# Patient Record
Sex: Female | Born: 1960 | Race: White | Hispanic: No | Marital: Married | Smoking: Never smoker
Health system: Southern US, Community
[De-identification: ages and names within clinical notes are randomized; demographics above are authoritative.]

## PROBLEM LIST (undated history)

## (undated) DIAGNOSIS — K219 Gastro-esophageal reflux disease without esophagitis: Secondary | ICD-10-CM

## (undated) DIAGNOSIS — N200 Calculus of kidney: Secondary | ICD-10-CM

## (undated) HISTORY — DX: Calculus of kidney: N20.0

## (undated) HISTORY — DX: Gastro-esophageal reflux disease without esophagitis: K21.9

## (undated) HISTORY — PX: EXTERNAL EAR SURGERY: SHX627

---

## 1999-10-03 ENCOUNTER — Other Ambulatory Visit: Admission: RE | Admit: 1999-10-03 | Discharge: 1999-10-03 | Payer: Self-pay | Admitting: *Deleted

## 2001-08-30 ENCOUNTER — Other Ambulatory Visit: Admission: RE | Admit: 2001-08-30 | Discharge: 2001-08-30 | Payer: Self-pay | Admitting: Obstetrics and Gynecology

## 2004-10-27 ENCOUNTER — Other Ambulatory Visit: Admission: RE | Admit: 2004-10-27 | Discharge: 2004-10-27 | Payer: Self-pay | Admitting: Obstetrics and Gynecology

## 2005-12-28 ENCOUNTER — Other Ambulatory Visit: Admission: RE | Admit: 2005-12-28 | Discharge: 2005-12-28 | Payer: Self-pay | Admitting: Obstetrics and Gynecology

## 2009-12-15 ENCOUNTER — Encounter: Payer: Self-pay | Admitting: Sports Medicine

## 2010-11-15 ENCOUNTER — Ambulatory Visit: Payer: Self-pay | Admitting: Sports Medicine

## 2010-11-15 DIAGNOSIS — M719 Bursopathy, unspecified: Secondary | ICD-10-CM

## 2010-11-15 DIAGNOSIS — M5030 Other cervical disc degeneration, unspecified cervical region: Secondary | ICD-10-CM | POA: Insufficient documentation

## 2010-11-15 DIAGNOSIS — M67919 Unspecified disorder of synovium and tendon, unspecified shoulder: Secondary | ICD-10-CM | POA: Insufficient documentation

## 2010-11-15 DIAGNOSIS — M542 Cervicalgia: Secondary | ICD-10-CM | POA: Insufficient documentation

## 2010-12-13 ENCOUNTER — Ambulatory Visit: Payer: Self-pay | Admitting: Sports Medicine

## 2011-01-02 ENCOUNTER — Ambulatory Visit
Admission: RE | Admit: 2011-01-02 | Discharge: 2011-01-02 | Payer: Self-pay | Source: Home / Self Care | Attending: Sports Medicine | Admitting: Sports Medicine

## 2011-01-02 ENCOUNTER — Encounter: Payer: Self-pay | Admitting: Sports Medicine

## 2011-01-12 ENCOUNTER — Encounter: Payer: Self-pay | Admitting: Sports Medicine

## 2011-01-12 ENCOUNTER — Encounter
Admission: RE | Admit: 2011-01-12 | Discharge: 2011-01-24 | Payer: Self-pay | Source: Home / Self Care | Attending: Sports Medicine | Admitting: Sports Medicine

## 2011-01-26 ENCOUNTER — Ambulatory Visit: Payer: PRIVATE HEALTH INSURANCE

## 2011-01-26 NOTE — Miscellaneous (Signed)
Summary: [facility]  [facility]   Imported By: Marily Memos 01/16/2011 09:19:30  _____________________________________________________________________  External Attachment:    Type:   Image     Comment:   External Document

## 2011-01-26 NOTE — Assessment & Plan Note (Signed)
Summary: F/U NECK/SHOULDER PAIN,[facility]   Vital Signs:  Patient profile:   50 year old female Pulse rate:   76 / minute BP sitting:   104 / 72  (left arm)  Vitals Entered By: Rochele Pages RN (January 02, 2011 11:18 AM)  CC:  neck and shoulder pain.  History of Present Illness: neck- dull annoying pain, not sharp or stabbing.  chronic discomfort, worse with sitting, better with activity.  takes advil PM for pain at night 2/2 difficulty sleeping.   doing exercises 3x/week.  Has been concentrating on her posture.  wants to avoid any injections.  complains of popping in her shoulder.   gets trapezius spasm primarily on RT and has more shoulder popping on  this side  Current Medications (verified): 1)  None  Allergies (verified): No Known Drug Allergies  Review of Systems  The patient denies weight gain, syncope, and dyspnea on exertion.    Physical Exam  General:  NAD thin Msk:  right neck.  Full ROM  negative spurlings.  no crepitus felt.  Trap spasm noted on RT and feels this w lat bend of neck to lt  some hypermobility of spine - able to put palms on floor - very mobile ROM at neckj  right shoulder: full ROM passive and active.  Mild decrease strength on empty can negative neers, negative hawkins, negative obriens.  good strength with internal and external rotation. scapular protraction on RT   neuuro ck for C5 to T1 reveals normal strength   Impression & Recommendations:  Problem # 1:  DEGENERATIVE DISC DISEASE, CERVICAL SPINE (ICD-722.4) to PT to help with trapezius spasm and scapular stabilzation exercises.  start amitryptilline at night.  Popping sensation in shoulder likely due to joint mobility.  RTC after 4 weeks of PT.  Problem # 2:  NECK PAIN (ICD-723.1) Assessment: Unchanged likely due to DDD.  continue exercise, posture, and tx as above.  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    Complete  Medication List: 1)  Amitriptyline Hcl 10 Mg Tabs (Amitriptyline hcl) .... Take one by mouth at bedtime and increase to 2 as directed Prescriptions: AMITRIPTYLINE HCL 10 MG TABS (AMITRIPTYLINE HCL) take one by mouth at bedtime and increase to 2 as directed  #60 x 3   Entered by:   Rochele Pages RN   Authorized by:   Enid Baas MD   Signed by:   Rochele Pages RN on 01/02/2011   Method used:   Electronically to        [facility] (retail)       [address]       [place], [place]  [address]       Ph: [phone] or [phone]       Fax: [phone]   RxID:   [id number]    Orders Added: 1)  Est. Patient Level III [phone]

## 2011-01-26 NOTE — Consult Note (Signed)
Summary: [facility]  [facility]   Imported By: Marily Memos 11/15/2010 11:12:33  _____________________________________________________________________  External Attachment:    Type:   Image     Comment:   External Document

## 2011-01-26 NOTE — Miscellaneous (Signed)
Summary: [facility] Referral form  [facility] Referral form   Imported By: Marily Memos 01/02/2011 13:45:44  _____________________________________________________________________  External Attachment:    Type:   Image     Comment:   External Document

## 2011-01-26 NOTE — Assessment & Plan Note (Signed)
Summary: NP NECK PAIN/[facility] WILL BRING MRI   Vital Signs:  Patient profile:   50 year old female Height:      61 inches Weight:      100 pounds BMI:     18.96 BP sitting:   112 / 76  Vitals Entered By: Lillia Pauls CMA (November 15, 2010 11:07 AM)   History of Present Illness: 50 yo female new pt coming here for neck pain Pt in 1994 had accident with horse but did not have any type of signs of acute problems.  Pt though over the years started to have pain right sided, radiates to right shoulder worse with certain movements, good strength, does not hurt everyday and still does ADL's.  Pt was seen at [facility] in 12/10 was told to do PT but only helped some and was told next step was injections which pt would like to avoid.    Pt did have an MRI done found to have tendonosis of the right rotator cuff.  MRI of neck showed bulging disk  of the C4-5.   there are anterior cystic changes; DDD adn mild changes at level above and below sclerotic vertebral body on plain film  Preventive Screening-Counseling & Management  Alcohol-Tobacco     Smoking Status: never  Current Medications (verified): 1)  None  Allergies (verified): No Known Drug Allergies  Past History:  unrelated to matter  Social History: Smoking Status:  never  Physical Exam  General:  Well-developed,well-nourished,in no acute distress; alert,appropriate and cooperative throughout examination Msk:  right neck.  Full ROM mild spasm of the right trapezius muscle.  negative spurlings.  no crepitus felt.  elevated first rib on right side. right shoulder: full ROM passive and active.  Mild decrease strength on empty can negative neers, negative hawkins, negative obriens.  good strength with internal and external rotation. 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 Will have pt attempt this at this time then NSAIDS as needed  do want to see pt again in 4months if not improving  Problem # 2:  NECK PAIN (ICD-723.1) no radiculopathy noted and good ROM with out pain.  No crepitus noted.  pt did have a mild elevated first rib and after verbal consent given FPR technique of the first rib improved pain.   Given exercises to help with posture and scapular dysfunction that could be contributing to the pain.  Gave pt red flags to look out for.  Will f/u in 4 months if needed to see if improvement.   Problem # 3:  DEGENERATIVE DISC DISEASE, CERVICAL SPINE (ICD-722.4) This could readily have come from accident there is some ant endplate compression and cystic erosion; edema in vert body on MRI  will follow and pursue conservative approach   Orders Added: 1)  Est. Patient Level IV [11914]

## 2011-01-30 ENCOUNTER — Ambulatory Visit: Payer: PRIVATE HEALTH INSURANCE | Admitting: Sports Medicine

## 2011-02-09 ENCOUNTER — Ambulatory Visit: Payer: Commercial Managed Care - PPO | Attending: Sports Medicine

## 2011-02-09 DIAGNOSIS — IMO0001 Reserved for inherently not codable concepts without codable children: Secondary | ICD-10-CM | POA: Insufficient documentation

## 2011-02-09 DIAGNOSIS — M25519 Pain in unspecified shoulder: Secondary | ICD-10-CM | POA: Insufficient documentation

## 2011-02-09 DIAGNOSIS — M542 Cervicalgia: Secondary | ICD-10-CM | POA: Insufficient documentation

## 2011-02-09 DIAGNOSIS — M6281 Muscle weakness (generalized): Secondary | ICD-10-CM | POA: Insufficient documentation

## 2011-02-13 ENCOUNTER — Ambulatory Visit: Payer: Commercial Managed Care - PPO

## 2011-02-15 ENCOUNTER — Ambulatory Visit: Payer: PRIVATE HEALTH INSURANCE | Admitting: Sports Medicine

## 2011-02-15 ENCOUNTER — Ambulatory Visit: Payer: Commercial Managed Care - PPO

## 2011-02-21 ENCOUNTER — Ambulatory Visit: Payer: Commercial Managed Care - PPO

## 2011-09-20 ENCOUNTER — Ambulatory Visit (INDEPENDENT_AMBULATORY_CARE_PROVIDER_SITE_OTHER): Payer: PRIVATE HEALTH INSURANCE | Admitting: Sports Medicine

## 2011-09-20 ENCOUNTER — Encounter: Payer: Self-pay | Admitting: Sports Medicine

## 2011-09-20 VITALS — BP 111/73 | HR 75 | Ht 62.0 in | Wt 105.0 lb

## 2011-09-20 DIAGNOSIS — M719 Bursopathy, unspecified: Secondary | ICD-10-CM

## 2011-09-20 DIAGNOSIS — M67919 Unspecified disorder of synovium and tendon, unspecified shoulder: Secondary | ICD-10-CM

## 2011-09-20 DIAGNOSIS — Z0000 Encounter for general adult medical examination without abnormal findings: Secondary | ICD-10-CM

## 2011-09-20 NOTE — Progress Notes (Signed)
  Subjective:    Patient ID: Sherri Wilson, female    DOB: 10/05/1961, 50 y.o.   MRN: [id number]  HPI Pain in left upper arm and shoulder Came w shifting tractor Now she has pain with using a pitchfork or doing too much vigorous activity with the left arm She does not have nighttime pain  RT shoulder had RCT Tennis w bursitis Injection and PT helped and RT is OK  ? About supplements No real health issues but she is concerned about whether she should take calcium and vitamin D and fish oil. She also has questions about estrogen replacement and alternatives for this. She currently is having a number of menopausal symptoms.   Review of Systems     Objective:   Physical Exam  No acute distress  RT Shoulder: Inspection reveals no abnormalities, atrophy or asymmetry. Palpation is normal with no tenderness over AC joint or bicipital groove. ROM is full in all planes. Rotator cuff strength normal throughout. No signs of impingement with negative Neer and Hawkin's tests, empty can. Speeds and Yergason's tests normal. No labral pathology noted with negative Obrien's, negative clunk and good stability. Normal scapular function observed. No painful arc and no drop arm sign. No apprehension sign  Left shoulder is also normal to evaluation with the exception that she has a positive empty can test and also a positive Hawkins test  Note that she has asymmetry with the right shoulder and upper chest wider than the left by 1 and 1/2 cm      Assessment & Plan:

## 2011-09-20 NOTE — Assessment & Plan Note (Signed)
She will start a series of rotator cuff exercises as described  She should not used the left shoulder for heavier work until this improves  We will recheck if not improved in 6 weeks

## 2011-09-20 NOTE — Patient Instructions (Signed)
First - work the rotator cuff as before 1.  Do some of the scapular exercises 2. Do band first below shoulder height and after 2 weeks above  To full elevation 3.  Go to light weight when band is easy - probably 3 lbs  Goal is only mild pain not more for rehab  Supplements Estrogen replacement - issues are risk of breast cancers and clots/  Benefit better bone density and probably less sxs  Optional approach is to look at diet and phyto estrogens  Calcium is key but maybe through food sources Vit D is a good additive but you may want to measure to decide  Fish Oil probably beneficial for heart/ maybe for joints - ? Is right form  Good approach may be a nutritional consult to see where you are and then decide. Wyona Almas , RD, PhD is a good resource  # day food diaries for you and Theron Arista and analyze and then do a consult

## 2011-09-20 NOTE — Assessment & Plan Note (Signed)
I discussed some of the advantages and disadvantages of various supplements.  I think she would do very well with a nutritional consult. She would prefer the idea of healthy nutrition to taking medications or supplements  Refer to [employer]

## 2011-10-27 ENCOUNTER — Emergency Department (HOSPITAL_COMMUNITY): Payer: 59

## 2011-10-27 ENCOUNTER — Emergency Department (HOSPITAL_COMMUNITY)
Admission: EM | Admit: 2011-10-27 | Discharge: 2011-10-27 | Disposition: A | Discharge status: Home / Self Care | Payer: 59 | Attending: Emergency Medicine | Admitting: Emergency Medicine

## 2011-10-27 DIAGNOSIS — R51 Headache: Secondary | ICD-10-CM | POA: Insufficient documentation

## 2011-10-27 DIAGNOSIS — S0010XA Contusion of unspecified eyelid and periocular area, initial encounter: Secondary | ICD-10-CM | POA: Insufficient documentation

## 2011-10-27 DIAGNOSIS — S8010XA Contusion of unspecified lower leg, initial encounter: Secondary | ICD-10-CM | POA: Insufficient documentation

## 2011-10-27 DIAGNOSIS — Y92009 Unspecified place in unspecified non-institutional (private) residence as the place of occurrence of the external cause: Secondary | ICD-10-CM | POA: Insufficient documentation

## 2011-10-27 DIAGNOSIS — M7989 Other specified soft tissue disorders: Secondary | ICD-10-CM | POA: Insufficient documentation

## 2011-10-27 DIAGNOSIS — IMO0002 Reserved for concepts with insufficient information to code with codable children: Secondary | ICD-10-CM | POA: Insufficient documentation

## 2012-09-17 ENCOUNTER — Ambulatory Visit: Payer: PRIVATE HEALTH INSURANCE | Admitting: Sports Medicine

## 2012-10-31 ENCOUNTER — Encounter: Payer: Self-pay | Admitting: Gastroenterology

## 2012-11-26 ENCOUNTER — Ambulatory Visit (INDEPENDENT_AMBULATORY_CARE_PROVIDER_SITE_OTHER): Payer: 59 | Admitting: Gastroenterology

## 2012-11-26 ENCOUNTER — Encounter: Payer: Self-pay | Admitting: Gastroenterology

## 2012-11-26 VITALS — BP 112/70 | HR 86 | Ht 62.0 in | Wt 102.3 lb

## 2012-11-26 DIAGNOSIS — K3189 Other diseases of stomach and duodenum: Secondary | ICD-10-CM

## 2012-11-26 DIAGNOSIS — Z1211 Encounter for screening for malignant neoplasm of colon: Secondary | ICD-10-CM

## 2012-11-26 MED ORDER — ESOMEPRAZOLE MAGNESIUM 40 MG PO CPDR: 40.0000 mg | DELAYED_RELEASE_CAPSULE | Freq: Every day | ORAL | Status: DC

## 2012-11-26 NOTE — Assessment & Plan Note (Signed)
Plan screening colonoscopy 

## 2012-11-26 NOTE — Assessment & Plan Note (Signed)
The patient's symptoms could be do to esophageal reflux. I also am suspicious that symptoms could be related to allergies or perhaps a postnasal drip.  Recommendations #1 trial of Nexium 40 mg daily

## 2012-11-26 NOTE — Progress Notes (Signed)
History of Present Illness: Pleasant 51 year old white female referred at the request of Dr. Marcelle Overlie for evaluation of indigestion. Approximately 6 months ago she had a persistent nonproductive cough for which she took over-the-counter Prilosec with perhaps some relief. Since that time she's had some fullness in her throat and has noticed excess belching. She denies pyrosis, per se. She denies dysphagia or hoarseness.    Past Medical History  Diagnosis Date  . Kidney stones    Past Surgical History  Procedure Date  . External ear surgery     plastic surgery, bitten by a horse   family history includes Hypertension in her father and Thyroid cancer in her daughter. Current Outpatient Prescriptions  Medication Sig Dispense Refill  . calcium carbonate (TUMS - DOSED IN MG ELEMENTAL CALCIUM) 500 MG chewable tablet Chew 1 tablet by mouth daily.      . citric acid-potassium citrate (POLYCITRA) 1100-334 MG/5ML solution Take 5 mLs by mouth 3 (three) times daily with meals.      . fish oil-omega-3 fatty acids 1000 MG capsule Take 2 g by mouth daily.      . progesterone (ENDOMETRIN) 100 MG vaginal insert Place 100 mg vaginally 2 (two) times daily.       Allergies as of 11/26/2012  . (No Known Allergies)    reports that she has never smoked. She has never used smokeless tobacco. She reports that she drinks alcohol. She reports that she does not use illicit drugs.     Review of Systems: Pertinent positive and negative review of systems were noted in the above HPI section. All other review of systems were otherwise negative.  Vital signs were reviewed in today's medical record Physical Exam: General: Well developed , well nourished, no acute distress Head: Normocephalic and atraumatic Eyes:  sclerae anicteric, EOMI Ears: Normal auditory acuity Mouth: No deformity or lesions Neck: Supple, no masses or thyromegaly Lungs: Clear throughout to auscultation Heart: Regular rate and rhythm; no  murmurs, rubs or bruits Abdomen: Soft, non tender and non distended. No masses, hepatosplenomegaly or hernias noted. Normal Bowel sounds Rectal:deferred Musculoskeletal: Symmetrical with no gross deformities  Skin: No lesions on visible extremities Pulses:  Normal pulses noted Extremities: No clubbing, cyanosis, edema or deformities noted Neurological: Alert oriented x 4, grossly nonfocal Cervical Nodes:  No significant cervical adenopathy Inguinal Nodes: No significant inguinal adenopathy Psychological:  Alert and cooperative. Normal mood and affect

## 2012-11-26 NOTE — Patient Instructions (Addendum)
It has been recommended to you by Dr Arlyce Dice that you schedule an elective colonoscopy We have given you Nexium samples today Call back in 2 weeks and let us know how your doing

## 2012-12-05 ENCOUNTER — Encounter: Payer: Self-pay | Admitting: Sports Medicine

## 2012-12-05 ENCOUNTER — Ambulatory Visit (INDEPENDENT_AMBULATORY_CARE_PROVIDER_SITE_OTHER): Payer: 59 | Admitting: Sports Medicine

## 2012-12-05 VITALS — BP 105/75 | HR 64 | Ht 62.0 in | Wt 102.0 lb

## 2012-12-05 DIAGNOSIS — M5030 Other cervical disc degeneration, unspecified cervical region: Secondary | ICD-10-CM

## 2012-12-05 MED ORDER — CYCLOBENZAPRINE HCL 5 MG PO TABS: 5.0000 mg | ORAL_TABLET | Freq: Every day | ORAL | Status: DC

## 2012-12-05 NOTE — Progress Notes (Signed)
  Subjective:    Patient ID: Sherri Wilson, female    DOB: Jun 18, 1961, 51 y.o.   MRN: [id number]  HPI  Pt presents to clinic for f/u of chronic rt sided neck pain. Has MRI showing DDD. Still has pain regularly, causes trouble sleeping. Takes ibuprofen as needed- which helps a little.  Rt shoulder pain resolved. Does home exercises regularly. PT helped.   In the past she try some amitriptyline for muscle spasm but this made her too sedated  Family history Older daughter has thyroid cancer that was successfully removed No other significant family history for breast or ovarian cancer  Past medical history  history of osteopenia She uses estradiol patches for this and I think he is taking intermittent Provera  Review of Systems     Objective:   Physical Exam Pleasant and in no acute distress  C5-T1 testing normal  Range of motion of the neck but she gets tightness and some pain on right rotation and right lateral bending Right trapezius shows spasm from the upper neck to the upper shoulder Reflexes are normal  Rt Shoulder: Inspection reveals no abnormalities, atrophy or asymmetry. Palpation is normal with no tenderness over AC joint or bicipital groove. ROM is full in all planes. Rotator cuff strength normal throughout. No signs of impingement with negative Neer and Hawkin's tests, empty can. Speeds and Yergason's tests normal. No labral pathology noted with negative Obrien's, negative clunk and good stability. Normal scapular function observed. No painful arc and no drop arm sign. No apprehension sign           Assessment & Plan:

## 2012-12-05 NOTE — Patient Instructions (Addendum)
Please do suggested exercises for your neck daily  Massage will also be helpful  Please follow up in 1 month  Thank you for seeing [place] today!

## 2012-12-05 NOTE — Assessment & Plan Note (Signed)
I gave her a series of the easy motion exercises and some isometric strength exercises for her neck  In addition we showed her a trapezius muscle energy technique and a technique for some self traction  I would like her to try Flexeril just 5 mg at nighttime for one month along with the exercise and see if we can give her some level of improvement

## 2013-01-14 ENCOUNTER — Ambulatory Visit (INDEPENDENT_AMBULATORY_CARE_PROVIDER_SITE_OTHER): Payer: 59 | Admitting: Sports Medicine

## 2013-01-14 VITALS — BP 105/68 | Ht 62.0 in | Wt 102.0 lb

## 2013-01-14 DIAGNOSIS — M7750 Other enthesopathy of unspecified foot: Secondary | ICD-10-CM

## 2013-01-14 DIAGNOSIS — M5030 Other cervical disc degeneration, unspecified cervical region: Secondary | ICD-10-CM

## 2013-01-14 DIAGNOSIS — M7740 Metatarsalgia, unspecified foot: Secondary | ICD-10-CM | POA: Insufficient documentation

## 2013-01-14 MED ORDER — CYCLOBENZAPRINE HCL 5 MG PO TABS: 5.0000 mg | ORAL_TABLET | Freq: Every day | ORAL | Status: DC

## 2013-01-14 NOTE — Assessment & Plan Note (Signed)
Metatarsal pad placed in shoes today She will use for the next week and if this does alleviate some of pain we will use the spread to measure her to place additional metatarsal pads in her hiking shoes and in her tennis shoes

## 2013-01-14 NOTE — Progress Notes (Signed)
HPI: Patient is a 52 year-old female who presents to clinic to follow up on neck pain and also to be evaluated for plantar metatarsal pain.  She has chronic neck pain, has been taking flexeril (Ibuprofen prior to flexeril) with good relief.  Has been doing neck motion and isometric strength exercises for one month, offering some improvement in neck pain.  MRI showed DDD. She rarely has to use Aleve but this does help when she has too much neck pain  Right foot pain: Patient states that pain occurs while hiking.  She points to lateral side of foot, plantar surface just over 3rd and 4th metatarsal heads.  States she also gets pain when standing for long periods of time.  Denies radiation of pain.  Pain relieved by rest.  Physical Exam: Neck: C4-T1 normal sensory and strength testing Has full ROM of neck.   Lateral flexion 5/5 strength. No radiation of pain down arms with movement of neck or arms.   + right trapezius spasm noted at level of shoulder Negative Spurling test  Right foot: Calluses noted over 3rd and 4th metatarsal heads. This is not seen on the left foot. No crepitus noted upon palpation of metatarsal joints. Mild TTP of metatarsals or metatarsal heads. Slight lateral deviation of right 2nd toe (compared with noticeable deviation of left 2nd toe.

## 2013-01-14 NOTE — Assessment & Plan Note (Signed)
Continue Flexeril Continue home exercise program Aleve as needed  Recheck in 3 months

## 2013-01-30 ENCOUNTER — Other Ambulatory Visit: Payer: Self-pay | Admitting: Sports Medicine

## 2013-03-11 ENCOUNTER — Telehealth: Payer: Self-pay | Admitting: Gastroenterology

## 2013-03-11 ENCOUNTER — Encounter: Payer: Self-pay | Admitting: Gastroenterology

## 2013-03-11 NOTE — Telephone Encounter (Signed)
Sherri Wilson,  Please call the patient and let her know that I got the ok for her to switch and to schedule her colonoscopy with Dr. Christella Hartigan.  She is looking at having the procedure on April 23rd when I spoke to her this morning.  Thanks,  Pattricia Boss

## 2013-03-11 NOTE — Telephone Encounter (Signed)
Appt scheduled for 04-16-13/Letter Sent to pt/yesi

## 2013-03-11 NOTE — Telephone Encounter (Signed)
Dr. Arlyce Dice,  Sherri Wilson is requesting to switch care to Dr. Christella Hartigan.  She said it is nothing against you but she is trying to schedule her colonoscopy for April and the dates that her husband can be with her, you are not scheduled to do procedures.  Thanks,    Pattricia Boss

## 2013-03-11 NOTE — Telephone Encounter (Signed)
ok 

## 2013-03-11 NOTE — Telephone Encounter (Signed)
Happy to help.  Colonoscopy for routine screening, LEC with moderate sedation.  Thanks

## 2013-04-03 ENCOUNTER — Ambulatory Visit (AMBULATORY_SURGERY_CENTER): Payer: 59 | Admitting: *Deleted

## 2013-04-03 VITALS — Ht 62.0 in | Wt 102.8 lb

## 2013-04-03 DIAGNOSIS — Z1211 Encounter for screening for malignant neoplasm of colon: Secondary | ICD-10-CM

## 2013-04-03 MED ORDER — PEG-KCL-NACL-NASULF-NA ASC-C 100 G PO SOLR: ORAL | Status: DC

## 2013-04-10 ENCOUNTER — Ambulatory Visit (INDEPENDENT_AMBULATORY_CARE_PROVIDER_SITE_OTHER): Payer: 59 | Admitting: Sports Medicine

## 2013-04-10 ENCOUNTER — Encounter: Payer: Self-pay | Admitting: Sports Medicine

## 2013-04-10 VITALS — BP 101/65 | HR 58 | Ht 62.0 in | Wt 102.0 lb

## 2013-04-10 DIAGNOSIS — M7750 Other enthesopathy of unspecified foot: Secondary | ICD-10-CM

## 2013-04-10 DIAGNOSIS — M7741 Metatarsalgia, right foot: Secondary | ICD-10-CM

## 2013-04-10 DIAGNOSIS — M67919 Unspecified disorder of synovium and tendon, unspecified shoulder: Secondary | ICD-10-CM

## 2013-04-10 DIAGNOSIS — M719 Bursopathy, unspecified: Secondary | ICD-10-CM

## 2013-04-10 DIAGNOSIS — M5030 Other cervical disc degeneration, unspecified cervical region: Secondary | ICD-10-CM

## 2013-04-10 MED ORDER — CYCLOBENZAPRINE HCL 5 MG PO TABS: 5.0000 mg | ORAL_TABLET | Freq: Every day | ORAL | Status: DC

## 2013-04-10 NOTE — Assessment & Plan Note (Signed)
Cont flexeril long term as needed to block radicular pain

## 2013-04-10 NOTE — Progress Notes (Signed)
  Subjective:    Patient ID: Sherri Wilson, female    DOB: 27-Nov-1961, 52 y.o.   MRN: [id number]  HPI  Pt presents to clinic for evaluation for orthotics. She has forefoot pain on the right foot that is improved with the MT pads that were added to her danskos.  She is going on a hiking trip this summer, and is concerned that her foot will bother her without increased support.   Neck pain was significantly helped by flexeril.  When she ran out of flexeril, neck pain returned.  Interested in getting refills.  She has been doing shoulder exercises - including up right rows and push ups- she has noticed that this has caused forearm pain.  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  Review of Systems     Objective:   Physical Exam  NAD   Standing Dropped 4th MT head on rt Good preservation of long arch bilat Trans arch slightly flattened bilat with lateral deviation of 2nd toe Small bunionettes bilat Calcaneal valgus  TTP over both forearms at mm belly; mild on RT lat epicondyle  Posture and neck motion are good     Assessment & Plan:

## 2013-04-10 NOTE — Patient Instructions (Addendum)
For forearm pain- use light weights to do slow wrist curls and ball squeezes  Ok to continue up right rows with light weight, but decrease or stop push ups  Please follow up as needed  Thank you for seeing [place] today!  Enjoy your hiking trip!

## 2013-04-10 NOTE — Assessment & Plan Note (Signed)
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

## 2013-04-10 NOTE — Assessment & Plan Note (Signed)
Sports insole to shoes for hiking with MT pad on RT over 3rd and 4th MT shaft  Cont small MT pads in all shoes Reck prn

## 2013-04-16 ENCOUNTER — Encounter: Payer: Self-pay | Admitting: Gastroenterology

## 2013-04-16 ENCOUNTER — Ambulatory Visit (AMBULATORY_SURGERY_CENTER): Payer: 59 | Admitting: Gastroenterology

## 2013-04-16 VITALS — BP 90/50 | HR 66 | Temp 97.1°F | Resp 12 | Ht 62.0 in | Wt 102.0 lb

## 2013-04-16 DIAGNOSIS — Z1211 Encounter for screening for malignant neoplasm of colon: Secondary | ICD-10-CM

## 2013-04-16 MED ORDER — SODIUM CHLORIDE 0.9 % IV SOLN: 500.0000 mL | INTRAVENOUS | Status: DC

## 2013-04-16 NOTE — Progress Notes (Signed)
Patient did not experience any of the following events: a burn prior to discharge; a fall within the facility; wrong site/side/patient/procedure/implant event; or a hospital transfer or hospital admission upon discharge from the facility. (G8907) Patient did not have preoperative order for IV antibiotic SSI prophylaxis. (G8918)  

## 2013-04-16 NOTE — Patient Instructions (Signed)
Normal colon exam today. Repeat colonoscopy in 10 years. Resume current medications. Call us with any questions or concerns. Thank you!!  YOU HAD AN ENDOSCOPIC PROCEDURE TODAY AT THE [facility]: Refer to the procedure report that was given to you for any specific questions about what was found during the examination.  If the procedure report does not answer your questions, please call your gastroenterologist to clarify.  If you requested that your care partner not be given the details of your procedure findings, then the procedure report has been included in a sealed envelope for you to review at your convenience later.  YOU SHOULD EXPECT: Some feelings of bloating in the abdomen. Passage of more gas than usual.  Walking can help get rid of the air that was put into your GI tract during the procedure and reduce the bloating. If you had a lower endoscopy (such as a colonoscopy or flexible sigmoidoscopy) you may notice spotting of blood in your stool or on the toilet paper. If you underwent a bowel prep for your procedure, then you may not have a normal bowel movement for a few days.  DIET: Your first meal following the procedure should be a light meal and then it is ok to progress to your normal diet.  A half-sandwich or bowl of soup is an example of a good first meal.  Heavy or fried foods are harder to digest and may make you feel nauseous or bloated.  Likewise meals heavy in dairy and vegetables can cause extra gas to form and this can also increase the bloating.  Drink plenty of fluids but you should avoid alcoholic beverages for 24 hours.  ACTIVITY: Your care partner should take you home directly after the procedure.  You should plan to take it easy, moving slowly for the rest of the day.  You can resume normal activity the day after the procedure however you should NOT DRIVE or use heavy machinery for 24 hours (because of the sedation medicines used during the test).    SYMPTOMS TO  REPORT IMMEDIATELY: A gastroenterologist can be reached at any hour.  During normal business hours, 8:30 AM to 5:00 PM Monday through Friday, call [phone].  After hours and on weekends, please call the GI answering service at [phone] who will take a message and have the physician on call contact you.   Following lower endoscopy (colonoscopy or flexible sigmoidoscopy):  Excessive amounts of blood in the stool  Significant tenderness or worsening of abdominal pains  Swelling of the abdomen that is new, acute  Fever of 100F or higher  Following upper endoscopy (EGD)  Vomiting of blood or coffee ground material  New chest pain or pain under the shoulder blades  Painful or persistently difficult swallowing  New shortness of breath  Fever of 100F or higher  Black, tarry-looking stools  FOLLOW UP: If any biopsies were taken you will be contacted by phone or by letter within the next 1-3 weeks.  Call your gastroenterologist if you have not heard about the biopsies in 3 weeks.  Our staff will call the home number listed on your records the next business day following your procedure to check on you and address any questions or concerns that you may have at that time regarding the information given to you following your procedure. This is a courtesy call and so if there is no answer at the home number and we have not heard from you through the emergency physician   on call, we will assume that you have returned to your regular daily activities without incident.  SIGNATURES/CONFIDENTIALITY: You and/or your care partner have signed paperwork which will be entered into your electronic medical record.  These signatures attest to the fact that that the information above on your After Visit Summary has been reviewed and is understood.  Full responsibility of the confidentiality of this discharge information lies with you and/or your care-partner. 

## 2013-04-16 NOTE — Op Note (Signed)
[facility] [address] [place], [address]   COLONOSCOPY PROCEDURE REPORT  PATIENT: Sherri Wilson, Sherri D.  MR#: [id number] BIRTHDATE: 11-06-1961 , 51  yrs. old GENDER: Female ENDOSCOPIST: Rachael Fee, MD REFERRED [id number] Marcelle Overlie, M.D. PROCEDURE DATE:  04/16/2013 PROCEDURE:   Colonoscopy, screening ASA CLASS:   Class II INDICATIONS:average risk screening. MEDICATIONS: Fentanyl 75 mcg IV, Versed 6 mg IV, and These medications were titrated to patient response per physician's verbal order  DESCRIPTION OF PROCEDURE:   After the risks benefits and alternatives of the procedure were thoroughly explained, informed consent was obtained.  A digital rectal exam revealed no abnormalities of the rectum.   The LB PCF-H180AL [id number]  endoscope was introduced through the anus and advanced to the cecum, which was identified by both the appendix and ileocecal valve. No adverse events experienced.   The quality of the prep was good.  The instrument was then slowly withdrawn as the colon was fully examined.   COLON FINDINGS: A normal appearing cecum, ileocecal valve, and appendiceal orifice were identified.  The ascending, hepatic flexure, transverse, splenic flexure, descending, sigmoid colon and rectum appeared unremarkable.  No polyps or cancers were seen. Retroflexed views revealed no abnormalities. The time to cecum=3 minutes 22 seconds.  Withdrawal time=6 minutes 33 seconds.  The scope was withdrawn and the procedure completed. COMPLICATIONS: There were no complications.  ENDOSCOPIC IMPRESSION: Normal colon No polyps or cancers  RECOMMENDATIONS: You should continue to follow colorectal cancer screening guidelines for "routine risk" patients with a repeat colonoscopy in 10 years. There is no need for FOBT (stool) testing for at least 5 years.   eSigned:  Rachael Fee, MD 04/16/2013 3:31 PM

## 2013-04-17 ENCOUNTER — Telehealth: Payer: Self-pay | Admitting: *Deleted

## 2013-04-17 NOTE — Telephone Encounter (Signed)
  Follow up Call-  Call back number 04/16/2013  Post procedure Call Back phone  # [phone]  Permission to leave phone message Yes     Patient questions:  Do you have a fever, pain , or abdominal swelling? no Pain Score  0 *  Have you tolerated food without any problems? yes  Have you been able to return to your normal activities? yes  Do you have any questions about your discharge instructions: Diet   no Medications  no Follow up visit  no  Do you have questions or concerns about your Care? no  Actions: * If pain score is 4 or above: No action needed, pain <4.

## 2013-05-07 ENCOUNTER — Encounter: Payer: 59 | Admitting: Sports Medicine

## 2013-06-16 ENCOUNTER — Other Ambulatory Visit: Payer: Self-pay | Admitting: Obstetrics and Gynecology

## 2013-06-16 DIAGNOSIS — R928 Other abnormal and inconclusive findings on diagnostic imaging of breast: Secondary | ICD-10-CM

## 2013-06-24 ENCOUNTER — Ambulatory Visit
Admission: RE | Admit: 2013-06-24 | Discharge: 2013-06-24 | Disposition: A | Discharge status: Home / Self Care | Payer: 59 | Source: Ambulatory Visit | Attending: Obstetrics and Gynecology | Admitting: Obstetrics and Gynecology

## 2013-06-24 DIAGNOSIS — R928 Other abnormal and inconclusive findings on diagnostic imaging of breast: Secondary | ICD-10-CM

## 2014-03-17 ENCOUNTER — Encounter: Payer: Self-pay | Admitting: Sports Medicine

## 2014-03-17 ENCOUNTER — Ambulatory Visit (INDEPENDENT_AMBULATORY_CARE_PROVIDER_SITE_OTHER): Payer: 59 | Admitting: Sports Medicine

## 2014-03-17 VITALS — BP 92/65 | Ht 62.0 in | Wt 95.0 lb

## 2014-03-17 DIAGNOSIS — M7750 Other enthesopathy of unspecified foot: Secondary | ICD-10-CM

## 2014-03-17 DIAGNOSIS — M7740 Metatarsalgia, unspecified foot: Secondary | ICD-10-CM

## 2014-03-17 DIAGNOSIS — M5030 Other cervical disc degeneration, unspecified cervical region: Secondary | ICD-10-CM

## 2014-03-17 MED ORDER — CYCLOBENZAPRINE HCL 5 MG PO TABS: 5.0000 mg | ORAL_TABLET | Freq: Every day | ORAL | Status: DC

## 2014-03-17 NOTE — Progress Notes (Signed)
   Subjective:    Patient ID: Sherri Wilson, female    DOB: 1961-10-21, 53 y.o.   MRN: [id number]  HPI  Sherri Wilson has a history of cervical deg. Disc Dz and chronic neck pain who returns to clinic for flexeril refill. She takes flexeril at night which significantly improves her symptoms. She is able to sleep well. During the day she is able to tolerate normal activities without being limited due to pain. She continues to do neck exercises.   She is also here complaining about right knee pain. Pain is located on the patella. Pain is worse with any compression--"wearing skinny jeans"--or something rubbing against it. Has mild discomfort with activity but denies pain with hiking, stairs, walking. She would like to proactive about injury prevention and is interested in exercises to prevent further knee pain. Denies locking, buckling, night pain.   She also has a history of foot pain and loss of transverse arch. She was previously given metatarsal pads but these did not improve symptoms and she was not able to tolerate them. She says that she is able to tolerate foot pain currently but is developing a callus on the right foot.    Review of Systems Negative apart from HPI     Objective:   Physical Exam NAD BP 92/65  Ht 5\' 2"  (1.575 m)  Wt 95 lb (43.092 kg)  BMI 17.37 kg/m2  Knee Inspection: no erythema, swelling. Palpation: Mild tenderness to palpation on medial patella. No tenderness along the joint line. ROM: Normal strength and ROM. Some crepitus with extension of both knees. Patella is tracking on the midline. VMO weakness noted Hip abduction strength excellent  Neck: Normal ROM without pain. No trapezius spasms.   Foot (Right): Callus developing at 4th metatarsal. Some loss of transverse arch bilaterally.      Assessment & Plan:  53 yo with chronic neck pain and new right knee pain.   1. Chronic Neck Pain: due to cervical DDD. Responsive to flexeril --Refill 5 MG flexeril,  safe to continue taking --Continue to do push-up exercises to strengthen trapezius  2. Right knee pain: Most likely VMO weakness --Excercises--straight leg raise and extension while holding something between the knees  3. Foot pain: callus developing on plantar metatarsals --Morton's neuroma pad insert to place just proximal to the callus  Seen with Jacqulyn Liner, MS4

## 2014-03-17 NOTE — Assessment & Plan Note (Signed)
Cont Flexeril long term  RF for 1 yr  HEP

## 2014-03-17 NOTE — Assessment & Plan Note (Signed)
MT pads uncomfortable  Will try neuroma pad

## 2014-04-30 ENCOUNTER — Encounter: Payer: Self-pay | Admitting: Sports Medicine

## 2014-04-30 ENCOUNTER — Ambulatory Visit (INDEPENDENT_AMBULATORY_CARE_PROVIDER_SITE_OTHER): Payer: 59 | Admitting: Sports Medicine

## 2014-04-30 VITALS — BP 103/71 | Ht 62.0 in | Wt 100.0 lb

## 2014-04-30 DIAGNOSIS — M7740 Metatarsalgia, unspecified foot: Secondary | ICD-10-CM

## 2014-04-30 DIAGNOSIS — M7750 Other enthesopathy of unspecified foot: Secondary | ICD-10-CM

## 2014-04-30 NOTE — Progress Notes (Signed)
Patient ID: Sherri Wilson, female   DOB: 1961/02/10, 53 y.o.   MRN: [id number]  Patient enters with persistent forefoot pain We had treated her for metatarsalgia with different types of metatarsal pads However, using these has never totally got rid of her pain She is wondering about trying some orthotics She works outside most days and also likes to walk and to hike A lot of these activities are causing significant foot pain  Examination No acute distress BP 103/71  Ht 5\' 2"  (1.575 m)  Wt 100 lb (45.36 kg)  BMI 18.29 kg/m2  Alignment is good Strength is good Forefoot shows complete loss of the transverse arch of the right foot The right fourth metatarsal head has subluxed to the plantar surface There is pain at the metatarsal head and just distal to this Longitudinal arch is normal but may have dropped from her baseline  Left foot shows mild loss of the transverse arch The forefoot metatarsal head also subluxes to the plantar surface but is freely movable No real abnormal callusing No tenderness to palpation  Walking gait is normal

## 2014-04-30 NOTE — Assessment & Plan Note (Signed)
And other conservative measures have failed we decided to try a pair of cushioned orthotics  Patient was fitted for a : standard, cushioned, semi-rigid orthotic. The orthotic was heated and afterward the patient stood on the orthotic blank positioned on the orthotic stand. The patient was positioned in subtalar neutral position and 10 degrees of ankle dorsiflexion in a weight bearing stance. After completion of molding, a stable base was applied to the orthotic blank. The blank was ground to a stable position for weight bearing. Size: 7 EVA Base: blue EVA Posting: Lateral RT forefoot Additional orthotic padding: none  Evaluation in preparation, 45 minutes face-to-face  Walking gait after completion of orthotics was much more comfortable  She will test these over the next one to 2 months but if we need to add additional metatarsal padding we will do so to the dorsal surface

## 2015-03-31 ENCOUNTER — Encounter: Payer: Self-pay | Admitting: Sports Medicine

## 2015-03-31 ENCOUNTER — Ambulatory Visit (INDEPENDENT_AMBULATORY_CARE_PROVIDER_SITE_OTHER): Payer: 59 | Admitting: Sports Medicine

## 2015-03-31 VITALS — BP 112/70 | HR 68 | Ht 61.0 in | Wt 100.0 lb

## 2015-03-31 DIAGNOSIS — M7740 Metatarsalgia, unspecified foot: Secondary | ICD-10-CM

## 2015-03-31 NOTE — Progress Notes (Signed)
Patient ID: Sherri Wilson, female   DOB: September 28, 1961, 53 y.o.   MRN: [id number]  The patient has had long-term problems with both forefeet Pain her orthotics one year ago Pain is returning to the forefeet and she is developing some corns She generally wears hiking Boot that gives her more support Sandals with an arch helped for a short period  Orthotics initially helped but less so in the last few months  Examination Thin and in no acute distress BP 112/70 mmHg  Pulse 68  Ht 5\' 1"  (1.549 m)  Wt 100 lb (45.36 kg)  BMI 18.90 kg/m2  Right foot shows significant callusing at the distal fourth ray Fourth MTP joint is subluxed to the plantar surface Mild transverse arch collapse  Left foot shows callusing across the metatarsal pad Morton's stopped callus and a distal corn at the second plantar MTP  Both feet show loss of the normal fat pad  Orthotic show significant wear over the metatarsal region

## 2015-03-31 NOTE — Assessment & Plan Note (Signed)
On the right foot I placed a neuroma pad distal metatarsal 4  Additional padding to the orthotic in the forefoot  Left orthotic I placed additional padding across the metatarsal area  She is to test these corrections but I think I need to make a new orthotics in 4-6 weeks as  she has worn thru the forefoot on the current pair

## 2015-04-07 ENCOUNTER — Other Ambulatory Visit: Payer: Self-pay | Admitting: Sports Medicine

## 2015-05-19 ENCOUNTER — Ambulatory Visit (INDEPENDENT_AMBULATORY_CARE_PROVIDER_SITE_OTHER): Payer: 59 | Admitting: Sports Medicine

## 2015-05-19 ENCOUNTER — Encounter: Payer: Self-pay | Admitting: Sports Medicine

## 2015-05-19 VITALS — BP 105/69 | HR 53 | Ht 61.0 in | Wt 100.0 lb

## 2015-05-19 DIAGNOSIS — M7740 Metatarsalgia, unspecified foot: Secondary | ICD-10-CM

## 2015-05-19 NOTE — Assessment & Plan Note (Signed)
The patient has had some interval breakdown of the left foot with more callusing The right foot looks improved from before Much less tenderness to palpation over the right  Patient was fitted for a standard, cushioned, semi-rigid orthotic.  The orthotic was heated and the patient stood on the orthotic blank positioned on the orthotic stand. The patient was positioned in subtalar neutral position and 10 degrees of ankle dorsiflexion in a weight bearing stance. After molding, a stable [employer] EVA base was applied to the orthotic blank.   The blank was ground to a stable position for weight bearing. Size: 7 EVA base: Blue EVA med. posting: none additional orthotic padding: extra small MT pads bilat  Evaluation time was 40 minutes  At completion the patient had good support and can walk in a neutral position

## 2015-05-19 NOTE — Progress Notes (Signed)
Patient ID: Sherri Wilson, female   DOB: 1961/10/13, 54 y.o.   MRN: [id number]  Patient has a history of chronic forefoot pain bilaterally Traditionally has been more on the right but now some on the left as well We placed her in the orthotics with metatarsal padding several years ago When she uses the orthotic she is able to do normal work and walking  She comes back today because the orthotics are starting to wear out Foot pain has increased as the orthotics are getting older  Examination No acute distress BP 105/69 mmHg  Pulse 53  Ht 5\' 1"  (1.549 m)  Wt 100 lb (45.36 kg)  BMI 18.90 kg/m2  Patient has long thin foot Fairly neutral longitudinal arch bilaterally Metatarsal pad on the plantar surface is significantly thinned out bilaterally Abnormal callus at fourth metatarsal head and just distal to the third metatarsal head on the right Fourth metatarsal heads seems subluxed through the pad  On the left she has abnormal callus slightly of the fourth metatarsal head and distal to the second metatarsal head There is some drop of the second and fourth metatarsals This does not seem to be through the foot pad

## 2015-06-16 ENCOUNTER — Other Ambulatory Visit: Payer: Self-pay | Admitting: Obstetrics and Gynecology

## 2015-06-16 DIAGNOSIS — R928 Other abnormal and inconclusive findings on diagnostic imaging of breast: Secondary | ICD-10-CM

## 2015-06-22 ENCOUNTER — Ambulatory Visit
Admission: RE | Admit: 2015-06-22 | Discharge: 2015-06-22 | Disposition: A | Discharge status: Home / Self Care | Payer: 59 | Source: Ambulatory Visit | Attending: Obstetrics and Gynecology | Admitting: Obstetrics and Gynecology

## 2015-06-22 DIAGNOSIS — R928 Other abnormal and inconclusive findings on diagnostic imaging of breast: Secondary | ICD-10-CM

## 2015-09-22 ENCOUNTER — Ambulatory Visit: Payer: 59 | Admitting: Sports Medicine

## 2015-10-27 ENCOUNTER — Ambulatory Visit: Payer: 59 | Admitting: Sports Medicine

## 2015-11-17 ENCOUNTER — Ambulatory Visit: Payer: 59 | Admitting: Sports Medicine

## 2016-03-21 DIAGNOSIS — D2262 Melanocytic nevi of left upper limb, including shoulder: Secondary | ICD-10-CM | POA: Diagnosis not present

## 2016-03-21 DIAGNOSIS — D2272 Melanocytic nevi of left lower limb, including hip: Secondary | ICD-10-CM | POA: Diagnosis not present

## 2016-03-21 DIAGNOSIS — D1801 Hemangioma of skin and subcutaneous tissue: Secondary | ICD-10-CM | POA: Diagnosis not present

## 2016-03-21 DIAGNOSIS — D225 Melanocytic nevi of trunk: Secondary | ICD-10-CM | POA: Diagnosis not present

## 2016-03-21 DIAGNOSIS — L821 Other seborrheic keratosis: Secondary | ICD-10-CM | POA: Diagnosis not present

## 2016-03-21 DIAGNOSIS — L812 Freckles: Secondary | ICD-10-CM | POA: Diagnosis not present

## 2016-03-21 DIAGNOSIS — D2271 Melanocytic nevi of right lower limb, including hip: Secondary | ICD-10-CM | POA: Diagnosis not present

## 2016-03-21 DIAGNOSIS — D2261 Melanocytic nevi of right upper limb, including shoulder: Secondary | ICD-10-CM | POA: Diagnosis not present

## 2016-03-28 ENCOUNTER — Ambulatory Visit: Payer: 59 | Admitting: Sports Medicine

## 2016-04-03 ENCOUNTER — Encounter: Payer: Self-pay | Admitting: Sports Medicine

## 2016-04-03 ENCOUNTER — Ambulatory Visit (INDEPENDENT_AMBULATORY_CARE_PROVIDER_SITE_OTHER): Payer: 59 | Admitting: Sports Medicine

## 2016-04-03 VITALS — BP 97/62 | HR 66 | Ht 61.0 in | Wt 100.0 lb

## 2016-04-03 DIAGNOSIS — M7740 Metatarsalgia, unspecified foot: Secondary | ICD-10-CM

## 2016-04-11 ENCOUNTER — Other Ambulatory Visit: Payer: Self-pay | Admitting: Sports Medicine

## 2016-05-03 DIAGNOSIS — H52223 Regular astigmatism, bilateral: Secondary | ICD-10-CM | POA: Diagnosis not present

## 2016-05-03 DIAGNOSIS — H524 Presbyopia: Secondary | ICD-10-CM | POA: Diagnosis not present

## 2016-05-03 DIAGNOSIS — H5203 Hypermetropia, bilateral: Secondary | ICD-10-CM | POA: Diagnosis not present

## 2016-05-31 NOTE — Assessment & Plan Note (Signed)
We will review this in future as we ran into a time conflict and she will return for that.  No charge as not seen today.

## 2016-05-31 NOTE — Progress Notes (Signed)
Patient ID: Sherri Wilson, female   DOB: [birth year], 55 y.o.   MRN: [id number]  Patient returning for foot issues  We ran into a time conflict as I had an urgent visit and so she rescheduled.

## 2016-06-14 DIAGNOSIS — Z1231 Encounter for screening mammogram for malignant neoplasm of breast: Secondary | ICD-10-CM | POA: Diagnosis not present

## 2016-06-14 DIAGNOSIS — Z13820 Encounter for screening for osteoporosis: Secondary | ICD-10-CM | POA: Diagnosis not present

## 2016-06-14 DIAGNOSIS — Z01419 Encounter for gynecological examination (general) (routine) without abnormal findings: Secondary | ICD-10-CM | POA: Diagnosis not present

## 2016-06-14 DIAGNOSIS — Z681 Body mass index (BMI) 19 or less, adult: Secondary | ICD-10-CM | POA: Diagnosis not present

## 2016-06-15 ENCOUNTER — Ambulatory Visit (INDEPENDENT_AMBULATORY_CARE_PROVIDER_SITE_OTHER): Payer: 59 | Admitting: Sports Medicine

## 2016-06-15 ENCOUNTER — Encounter: Payer: Self-pay | Admitting: Sports Medicine

## 2016-06-15 VITALS — BP 92/57 | Ht 62.0 in | Wt 98.0 lb

## 2016-06-15 DIAGNOSIS — M7740 Metatarsalgia, unspecified foot: Secondary | ICD-10-CM

## 2016-06-15 MED ORDER — CYCLOBENZAPRINE HCL 5 MG PO TABS: 5.0000 mg | ORAL_TABLET | Freq: Every day | ORAL | Status: DC

## 2016-06-15 NOTE — Assessment & Plan Note (Signed)
Unable to tolerate the metatarsal pad on the right. This seems to be placed 2 proximally which was most likely irritating her metatarsal shafts. We went ahead and adjusted this today. Repeat orthotics in the next 3-6 months. She also has evidence of bunionette's which fifth ray posting was added today.

## 2016-06-15 NOTE — Progress Notes (Signed)
Patient ID: Sherri Wilson, female   DOB: [birth year], 55 y.o.   MRN: [id number]   Patient returns today for bilateral foot pain.  Patient has a history of chronic forefoot pain bilaterally Traditionally has been more on the right but now some on the left as well We placed her in the orthotics with metatarsal padding several years ago When she uses the orthotic she is able to do normal work and walking  Patient with previous orthotics and metatarsal pads. Metatarsal pad on the right was giving her more irritation so she removed this from the orthotic. She continues to have pain in the midfoot area. We'll also having pain in the fifth dorsal MTP region.  Past medical family social history exam findings are reviewed and updated.  Examination No acute distress, aao x 3 BP 92/57 mmHg  Ht 5\' 2"  (1.575 m)  Wt 98 lb (44.453 kg)  BMI 17.92 kg/m2  Patient has long thin foot Fairly neutral longitudinal arch bilaterally Metatarsal pad on the plantar surface is significantly thinned out bilaterally Abnormal callus at fourth metatarsal head and just distal to the third metatarsal head on the right + irritation and bunionette 5th mtp  On the left she has abnormal callus slightly of the fourth metatarsal head and distal to the second metatarsal head There is some drop of the second and fourth metatarsals

## 2016-12-20 ENCOUNTER — Other Ambulatory Visit: Payer: Self-pay | Admitting: Family Medicine

## 2017-05-24 DIAGNOSIS — D2272 Melanocytic nevi of left lower limb, including hip: Secondary | ICD-10-CM | POA: Diagnosis not present

## 2017-05-24 DIAGNOSIS — L812 Freckles: Secondary | ICD-10-CM | POA: Diagnosis not present

## 2017-05-24 DIAGNOSIS — D1801 Hemangioma of skin and subcutaneous tissue: Secondary | ICD-10-CM | POA: Diagnosis not present

## 2017-05-24 DIAGNOSIS — L821 Other seborrheic keratosis: Secondary | ICD-10-CM | POA: Diagnosis not present

## 2017-05-24 DIAGNOSIS — D2262 Melanocytic nevi of left upper limb, including shoulder: Secondary | ICD-10-CM | POA: Diagnosis not present

## 2017-05-24 DIAGNOSIS — D2271 Melanocytic nevi of right lower limb, including hip: Secondary | ICD-10-CM | POA: Diagnosis not present

## 2017-05-24 DIAGNOSIS — D2261 Melanocytic nevi of right upper limb, including shoulder: Secondary | ICD-10-CM | POA: Diagnosis not present

## 2017-05-24 DIAGNOSIS — D225 Melanocytic nevi of trunk: Secondary | ICD-10-CM | POA: Diagnosis not present

## 2017-05-24 DIAGNOSIS — L814 Other melanin hyperpigmentation: Secondary | ICD-10-CM | POA: Diagnosis not present

## 2017-06-26 DIAGNOSIS — Z1231 Encounter for screening mammogram for malignant neoplasm of breast: Secondary | ICD-10-CM | POA: Diagnosis not present

## 2017-06-26 DIAGNOSIS — Z681 Body mass index (BMI) 19 or less, adult: Secondary | ICD-10-CM | POA: Diagnosis not present

## 2017-06-26 DIAGNOSIS — Z01419 Encounter for gynecological examination (general) (routine) without abnormal findings: Secondary | ICD-10-CM | POA: Diagnosis not present

## 2018-02-14 ENCOUNTER — Encounter: Payer: Self-pay | Admitting: Sports Medicine

## 2018-02-14 ENCOUNTER — Ambulatory Visit: Payer: 59 | Admitting: Sports Medicine

## 2018-02-14 DIAGNOSIS — M7740 Metatarsalgia, unspecified foot: Secondary | ICD-10-CM

## 2018-02-14 NOTE — Assessment & Plan Note (Signed)
Increase in flattening of transverse arch since last visit Lt > RT More IP callus  Trial with orthotic modification Float the first IP joint bilat 5th MTP joint pad bilat  If these help we need to go ahead and make new orthotics and add similar cushion

## 2018-02-14 NOTE — Progress Notes (Signed)
CC: left foot pain - great toe  Patient in custom orthotics since 2015 These lessened her foot pain She uses them for walking and work Work is primarily caring for horses Hx of forefoot pain Gradually improved MT pad left and neuroma pad RT Additonal padding for first ray  Recently more pain under IP joint of left grt toe More callus Some on RT  ROS No swelling of great toe No pain in other areas of foot No hx trauma  PE Pleasant thin F in NAD BP (!) 84/50   Ht 5\' 2"  (1.575 m)   Wt 102 lb (46.3 kg)   BMI 18.66 kg/m   TTP at plantar surface of IP joint toe 1 left Thick callus over this area Normal ROM of IP and MTP RT IP joint 1 shows no TTP No swelling of any foot joint  Standing shows loss of transverse arch bilat. Buninette pad over dorsum of 5th MTP bilat

## 2018-05-22 ENCOUNTER — Encounter: Payer: Self-pay | Admitting: Gastroenterology

## 2018-05-22 ENCOUNTER — Ambulatory Visit: Payer: 59 | Admitting: Gastroenterology

## 2018-05-22 ENCOUNTER — Encounter (INDEPENDENT_AMBULATORY_CARE_PROVIDER_SITE_OTHER): Payer: Self-pay

## 2018-05-22 VITALS — BP 80/50 | HR 72 | Ht 62.0 in | Wt 99.0 lb

## 2018-05-22 DIAGNOSIS — R194 Change in bowel habit: Secondary | ICD-10-CM | POA: Diagnosis not present

## 2018-05-22 NOTE — Patient Instructions (Addendum)
Please start taking citrucel (orange flavored) powder fiber supplement.  This may cause some bloating at first but that usually goes away. Begin with a small spoonful and work your way up to a large, heaping spoonful daily over a week. Stay on the daily fiber throughout your travels. Take imodium one pill daily (IF NEEDED) when you travel. Call after your July trip to report on your response.  Normal BMI (Body Mass Index- based on height and weight) is between 19 and 25. Your BMI today is Body mass index is 18.11 kg/m. Marland Kitchen Please consider follow up  regarding your BMI with your Primary Care Provider.

## 2018-05-22 NOTE — Progress Notes (Signed)
HPI: This is a very pleasant 57 year old woman who was referred to me by Molli Posey, MD  to evaluate bowel changes.    Chief complaint is bowel changes when she travels and then she has significant urgency,  [place] Road[place] trips, hiking a lot.  Out of country at times but not always.  At first she is constipated fecal incontinence at times even.  One BM daily is her usual.  No pushing.  Psych stress causes constipation.  Diet changes  Overall stable weight  No blood in her stools.  Father has colon polyps   Old Data Reviewed: Colonoscopy Dr.Jacobs April 2014 for routine risk colon cancer screening was normal.  She was recommended to undergo repeat colon cancer screening at 10-year interval.     Review of systems: Pertinent positive and negative review of systems were noted in the above HPI section. All other review negative.   Past Medical History:  Diagnosis Date  . GERD (gastroesophageal reflux disease)   . Kidney stones     Past Surgical History:  Procedure Laterality Date  . EXTERNAL EAR SURGERY Left    plastic surgery, bitten by a horse    Current Outpatient Medications  Medication Sig Dispense Refill  . Calcium Citrate-Vitamin D (CITRACAL PETITES/VITAMIN D PO) Take 2 tablets by mouth daily.    Marland Kitchen estradiol (VIVELLE-DOT) 0.05 MG/24HR Place 1 patch onto the skin once a week.    . fish oil-omega-3 fatty acids 1000 MG capsule Take 2 g by mouth daily.    . progesterone (PROMETRIUM) 100 MG capsule Take 100 mg by mouth daily.     No current facility-administered medications for this visit.     Allergies as of 05/22/2018  . (No Known Allergies)    Family History  Problem Relation Age of Onset  . Hypertension Father   . Colon polyps Father   . Thyroid cancer Daughter   . Colon cancer Neg Hx   . Esophageal cancer Neg Hx   . Stomach cancer Neg Hx   . Rectal cancer Neg Hx     Social History   Socioeconomic History  . Marital status: Married    Spouse  name: Not on file  . Number of children: 3  . Years of education: Not on file  . Highest education level: Not on file  Occupational History  . Occupation: homemaker  Social Needs  . Financial resource strain: Not on file  . Food insecurity:    Worry: Not on file    Inability: Not on file  . Transportation needs:    Medical: Not on file    Non-medical: Not on file  Tobacco Use  . Smoking status: Never Smoker  . Smokeless tobacco: Never Used  Substance and Sexual Activity  . Alcohol use: Yes    Comment: social alcohol  . Drug use: No  . Sexual activity: Not on file  Lifestyle  . Physical activity:    Days per week: Not on file    Minutes per session: Not on file  . Stress: Not on file  Relationships  . Social connections:    Talks on phone: Not on file    Gets together: Not on file    Attends religious service: Not on file    Active member of club or organization: Not on file    Attends meetings of clubs or organizations: Not on file    Relationship status: Not on file  . Intimate partner violence:    Fear of  current or ex partner: Not on file    Emotionally abused: Not on file    Physically abused: Not on file    Forced sexual activity: Not on file  Other Topics Concern  . Not on file  Social History Narrative  . Not on file     Physical Exam: BP (!) 80/50   Pulse 72   Ht 5\' 2"  (1.575 m)   Wt 99 lb (44.9 kg)   BMI 18.11 kg/m  Constitutional: generally well-appearing Psychiatric: alert and oriented x3 Eyes: extraocular movements intact Mouth: oral pharynx moist, no lesions Neck: supple no lymphadenopathy Cardiovascular: heart regular rate and rhythm Lungs: clear to auscultation bilaterally Abdomen: soft, nontender, nondistended, no obvious ascites, no peritoneal signs, normal bowel sounds Extremities: no lower extremity edema bilaterally Skin: no lesions on visible extremities   Assessment and plan: 58 y.o. female with stress, travel related bowel  changes  She is very clear that her bowels are absolutely normal unless she travels.  She normally has 1 bowel movement a day shortly after her morning coffee.  It is solid and she does not have to push or strain.  She does not see any bleeding.  Occasionally during times of emotional, psychological stress she will have constipation for a few days.  When she travels she often has upfront constipation and then urgency, significant loose stools with fecal incontinence even.  I do not think she has chronic infection.  I so no need for repeat colonoscopy or other testing right now.  I think this is a functional issue.  She is going to start taking over-the-counter daily fiber supplements now and continue that regimen while she travels.  She will also bring with her some Imodium and she knows that if she starts to have significant loose stools when she travels despite the fiber supplement she will take an Imodium 1 pill once daily and up to 2 or 3 pills once daily if needed.  She will report back to me after her first big trip that is in late July    Please see the "Patient Instructions" section for addition details about the plan.   Owens Loffler, MD [facility] 05/22/2018, 9:56 AM  Cc: Molli Posey, MD

## 2018-05-24 DIAGNOSIS — D2261 Melanocytic nevi of right upper limb, including shoulder: Secondary | ICD-10-CM | POA: Diagnosis not present

## 2018-05-24 DIAGNOSIS — D2262 Melanocytic nevi of left upper limb, including shoulder: Secondary | ICD-10-CM | POA: Diagnosis not present

## 2018-05-24 DIAGNOSIS — L821 Other seborrheic keratosis: Secondary | ICD-10-CM | POA: Diagnosis not present

## 2018-05-24 DIAGNOSIS — D1801 Hemangioma of skin and subcutaneous tissue: Secondary | ICD-10-CM | POA: Diagnosis not present

## 2018-05-24 DIAGNOSIS — D2272 Melanocytic nevi of left lower limb, including hip: Secondary | ICD-10-CM | POA: Diagnosis not present

## 2018-05-24 DIAGNOSIS — D2271 Melanocytic nevi of right lower limb, including hip: Secondary | ICD-10-CM | POA: Diagnosis not present

## 2018-05-24 DIAGNOSIS — D225 Melanocytic nevi of trunk: Secondary | ICD-10-CM | POA: Diagnosis not present

## 2018-05-24 DIAGNOSIS — L812 Freckles: Secondary | ICD-10-CM | POA: Diagnosis not present

## 2018-06-20 ENCOUNTER — Encounter: Payer: Self-pay | Admitting: Sports Medicine

## 2018-06-20 ENCOUNTER — Ambulatory Visit: Payer: 59 | Admitting: Sports Medicine

## 2018-06-20 DIAGNOSIS — M7740 Metatarsalgia, unspecified foot: Secondary | ICD-10-CM | POA: Diagnosis not present

## 2018-06-20 NOTE — Assessment & Plan Note (Signed)
Patient was fitted for a : standard, cushioned, semi-rigid orthotic. The orthotic was heated and afterward the patient stood on the orthotic blank positioned on the orthotic stand. The patient was positioned in subtalar neutral position and 10 degrees of ankle dorsiflexion in a weight bearing stance. After completion of molding, a stable base was applied to the orthotic blank. The blank was ground to a stable position for weight bearing. Size:7 red med. EVA Base: blue med. EVA Posting: MT cookies bilateral Additional orthotic padding: Posting on plantar surface MTP 1 and 5 Posting at distal phalanx 1 bilat  At conclusion walking gait was comfortable and was in neutral position  Advised to cont. Using orthotics in all shoes for walking and yard/ farm work These should provide about 3 years of good support RECK as needed

## 2018-06-20 NOTE — Progress Notes (Signed)
CC: Bilateral forefoot pain  Patient first treated with custom orthotics > 4 years ago with bilateral forefoot pain Exam suggested metatarsalgia Padding and custom orthotics have been helpful Planning hiking trip Older orthotics not as effective now and comes for new ones  ROS No swelling in feet No numbness in feet  PE Thin F in NAD BP (!) 93/58   Ht 5\' 2"  (1.575 m)   Wt 102 lb (46.3 kg)   BMI 18.66 kg/m   Loss of transverse arch bilat MT heads 2,3,4 bilat not cushioned with thin fat pad No real TTP today Long arch slightly decreased First phalanx medailly rotated with callus bilat Walking gait with mild pronation

## 2018-06-24 DIAGNOSIS — Z1321 Encounter for screening for nutritional disorder: Secondary | ICD-10-CM | POA: Diagnosis not present

## 2018-06-24 DIAGNOSIS — Z13228 Encounter for screening for other metabolic disorders: Secondary | ICD-10-CM | POA: Diagnosis not present

## 2018-06-24 DIAGNOSIS — Z13220 Encounter for screening for lipoid disorders: Secondary | ICD-10-CM | POA: Diagnosis not present

## 2018-06-24 DIAGNOSIS — Z1329 Encounter for screening for other suspected endocrine disorder: Secondary | ICD-10-CM | POA: Diagnosis not present

## 2018-07-03 DIAGNOSIS — Z1231 Encounter for screening mammogram for malignant neoplasm of breast: Secondary | ICD-10-CM | POA: Diagnosis not present

## 2018-07-03 DIAGNOSIS — Z01419 Encounter for gynecological examination (general) (routine) without abnormal findings: Secondary | ICD-10-CM | POA: Diagnosis not present

## 2018-07-03 DIAGNOSIS — Z13820 Encounter for screening for osteoporosis: Secondary | ICD-10-CM | POA: Diagnosis not present

## 2018-07-03 DIAGNOSIS — Z681 Body mass index (BMI) 19 or less, adult: Secondary | ICD-10-CM | POA: Diagnosis not present

## 2018-07-23 ENCOUNTER — Encounter: Payer: Self-pay | Admitting: Physical Therapy

## 2018-07-23 ENCOUNTER — Other Ambulatory Visit: Payer: Self-pay

## 2018-07-23 ENCOUNTER — Ambulatory Visit: Payer: 59 | Attending: Obstetrics and Gynecology | Admitting: Physical Therapy

## 2018-07-23 DIAGNOSIS — R279 Unspecified lack of coordination: Secondary | ICD-10-CM | POA: Insufficient documentation

## 2018-07-23 DIAGNOSIS — M6281 Muscle weakness (generalized): Secondary | ICD-10-CM | POA: Insufficient documentation

## 2018-07-23 NOTE — Therapy (Signed)
[facility] [address] [place], [place], [address] Phone: [phone]   Fax:  [phone]  Physical Therapy Evaluation  Patient Details  Name: Sherri Wilson MRN: [id number] Date of Birth: 1961-11-16 Referring Provider: Dr. Molli Posey   Encounter Date: 07/23/2018  PT End of Session - 07/23/18 1201    Visit Number  1    Date for PT Re-Evaluation  10/15/18    Authorization Type  UMR    PT Start Time  1015    PT Stop Time  1100    PT Time Calculation (min)  45 min    Activity Tolerance  Patient tolerated treatment well    Behavior During Therapy  [facility] for tasks assessed/performed       Past Medical History:  Diagnosis Date  . GERD (gastroesophageal reflux disease)   . Kidney stones     Past Surgical History:  Procedure Laterality Date  . EXTERNAL EAR SURGERY Left    plastic surgery, bitten by a horse    There were no vitals filed for this visit.   Subjective Assessment - 07/23/18 1013    Subjective  Leak urine with jogging, running, ride horses has to wear a pad,     Patient Stated Goals  reduce urinary leakage    Currently in Pain?  No/denies    Multiple Pain Sites  No         [facility] PT Assessment - 07/23/18 0001      Assessment   Medical Diagnosis  N39.3 Stress urinary incontinence    Referring Provider  Dr. Molli Posey    Onset Date/Surgical Date  07/23/17    Prior Therapy  none      Precautions   Precautions  None      Restrictions   Weight Bearing Restrictions  No      Balance Screen   Has the patient fallen in the past 6 months  No    Has the patient had a decrease in activity level because of a fear of falling?   No    Is the patient reluctant to leave their home because of a fear of falling?   No      Home Film/video editor residence      Prior Function   Level of Independence  Independent    Vocation  Retired    Leisure  ride horses, run,  Hospital doctor, gym 3x/week      Cognition   Overall Cognitive Status  Within [employer] for tasks assessed      Posture/Postural Control   Posture/Postural Control  No significant limitations      ROM / Strength   AROM / PROM / Strength  AROM;Strength;PROM      AROM   Lumbar Extension  decreased by 25%      PROM   Right Hip External Rotation   55    Left Hip External Rotation   55      Strength   Left Hip ABduction  3+/5      Palpation   SI assessment   right ilium is anteriorly rotated; sacrum rotated left    Palpation comment  tight abdominal tissue, unable to perform diaphragmatic breathing      Transfers   Transfers  Not assessed      Ambulation/Gait   Ambulation/Gait  No                Objective measurements completed  on examination: See above findings.    Pelvic Floor Special Questions - 07/23/18 0001    Prior Pregnancies  Yes    Number of Pregnancies  3    Number of Vaginal Deliveries  3    Currently Sexually Active  Yes    Is this Painful  No    Urinary Leakage  Yes    Pad use  1    Activities that cause leaking  Exercising;Other;Coughing;Sneezing;Laughing    Other activities that cause leaking  horseback riding    Urinary urgency  No    Fecal incontinence  Yes after traveling, gets constipated    Caffeine beverages  2 cups    Skin Integrity  Intact dry    External Palpation  clitoral hood is starting to adhere to the clitoris    Prolapse  Anterior Wall 1 cm from the introitus    Pelvic Floor Internal Exam  Patient confirms identification and approves PT to assess pelvic floor and treat    Exam Type  Vaginal    Palpation  tightness of the ichiococcygeus, sacrotuberus ligament; decreased movement of bilatteral urethra sphincter    Strength  weak squeeze, no lift               PT Education - 07/23/18 1200    Education Details  pelvic floor contraction, toileting technique, abdominal massage    Person(s) Educated  Patient    Methods   Explanation;Demonstration;Verbal cues;Handout    Comprehension  Verbalized understanding;Returned demonstration       PT Short Term Goals - 07/23/18 1209      PT SHORT TERM GOAL #1   Title  independent with initial HEP    Time  4    Period  Weeks    Status  New    Target Date  08/20/18      PT SHORT TERM GOAL #2   Title  ability to perform diaphgramatic breathing to improve mobility of pelvic floor    Time  4    Period  Weeks    Status  New    Target Date  08/20/18      PT SHORT TERM GOAL #3   Title  understand correct toileting technique to not strain the pelvic floor    Time  4    Period  Weeks    Status  New    Target Date  08/20/18      PT SHORT TERM GOAL #4   Title  understand how to perform abdominal massage to improve motility of the intestines and bowel movements    Time  4    Period  Weeks    Status  New    Target Date  08/20/18        PT Long Term Goals - 07/23/18 1215      PT LONG TERM GOAL #1   Title  independent with HEP and understand how to progress herself    Time  12    Period  Weeks    Status  New    Target Date  10/15/18      PT LONG TERM GOAL #2   Title  urinary leakage is minimal to none with horseback riding, running, and exercise due to improve coordination of the pelvic floor    Time  12    Period  Weeks    Status  New    Target Date  07/30/18      PT LONG TERM GOAL #3  Title  fecal leakage is none with traveling due to understanding how to reduce constipation    Time  12    Period  Weeks    Status  New    Target Date  10/15/18      PT LONG TERM GOAL #4   Title  pelvic floor strength >/= 4/5 without contracting the gluteals     Time  12    Period  Weeks    Status  New    Target Date  10/15/18             Plan - 07/23/18 1202    Clinical Impression Statement  Patient is a 57 year old female with stress urinary incontinence for the past year with exercise, riding horses. Patient reports issues of constipation and  sometimes fecal leakage.  Patient reports no pain.  As patient bears downt the bladder comes 1 cm before the introitus. Patient pelvic floor strength is 2/5 with difficulty isolating the contraction from contracting the gluteals.  Palpable tenderness located in the ischiococcygeus and decreased mobility of the urethra spincter and bulbocavernosus.  Patient has restrictions of the clitoral hood and understands how to release them.  Patient is not able to release her abdominals for diaphgramatic breathing.  Bilateral hip extension is 4/5.  Patient will wear 1 pad with horseback riding. Patient will benefit from skilled therapy to improve pelvic floor coordination, lengthen the muslces and reduce leakage.     History and Personal Factors relevant to plan of care:  none    Clinical Presentation  Stable    Clinical Presentation due to:  stable condition    Clinical Decision Making  Low    Rehab Potential  Excellent    Clinical Impairments Affecting Rehab Potential  none    PT Frequency  1x / week    PT Duration  12 weeks    PT Treatment/Interventions  Biofeedback;Therapeutic activities;Therapeutic exercise;Patient/family education;Neuromuscular re-education;Manual techniques;Energy conservation    PT Next Visit Plan  review toileting and abdominal massage, diaphgramatic breathing, pelvic floor soft tissue work, flexibility exercises for hips, lumbar, correct pelvis    Consulted and Agree with Plan of Care  Patient       Patient will benefit from skilled therapeutic intervention in order to improve the following deficits and impairments:  Increased fascial restricitons, Increased muscle spasms, Decreased activity tolerance, Decreased range of motion, Decreased strength, Impaired flexibility  Visit Diagnosis: Unspecified lack of coordination - Plan: PT plan of care cert/re-cert  Muscle weakness (generalized) - Plan: PT plan of care cert/re-cert     Problem List Patient Active Problem List    Diagnosis Date Noted  . 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 Dyspepsia and other specified disorders of function of stomach 11/26/2012  . Special screening for malignant neoplasms, colon 11/26/2012  . Healthcare maintenance 09/20/2011  . 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 NECK PAIN 11/15/2010  . ROTATOR CUFF SYNDROME 11/15/2010    Earlie Counts, PT 07/23/18 12:22 PM   [facility] [address], [place], [place], [address] Phone: [phone]   Fax:  [phone]  Name: Sherri Wilson MRN: [id number] Date of Birth: 1961/04/05

## 2018-07-23 NOTE — Patient Instructions (Addendum)
Slow Contraction: Gravity Eliminated (Hook-Lying)    Lie with hips and knees bent. Slowly squeeze pelvic floor for _5__ seconds. Rest for _10__ seconds. Repeat _5__ times. Do __2_ times a day.   Copyright  VHI. All rights reserved.  About Abdominal Massage  Abdominal massage, also called external colon massage, is a self-treatment circular massage technique that can reduce and eliminate gas and ease constipation. The colon naturally contracts in waves in a clockwise direction starting from inside the right hip, moving up toward the ribs, across the belly, and down inside the left hip.  When you perform circular abdominal massage, you help stimulate your colon's normal wave pattern of movement called peristalsis.  It is most beneficial when done after eating.  Positioning You can practice abdominal massage with oil while lying down, or in the shower with soap.  Some people find that it is just as effective to do the massage through clothing while sitting or standing.  How to Massage Start by placing your finger tips or knuckles on your right side, just inside your hip bone.  . Make small circular movements while you move upward toward your rib cage.   . Once you reach the bottom right side of your rib cage, take your circular movements across to the left side of the bottom of your rib cage.  . Next, move downward until you reach the inside of your left hip bone.  This is the path your feces travel in your colon. . Continue to perform your abdominal massage in this pattern for 10 minutes each day.     You can apply as much pressure as is comfortable in your massage.  Start gently and build pressure as you continue to practice.  Notice any areas of pain as you massage; areas of slight pain may be relieved as you massage, but if you have areas of significant or intense pain, consult with your healthcare provider.  Other Considerations . General physical activity including bending and  stretching can have a beneficial massage-like effect on the colon.  Deep breathing can also stimulate the colon because breathing deeply activates the same nervous system that supplies the colon.   . Abdominal massage should always be used in combination with a bowel-conscious diet that is high in the proper type of fiber for you, fluids (primarily water), and a regular exercise program.  Toileting Techniques for Bowel Movements (Defecation) Using your belly (abdomen) and pelvic floor muscles to have a bowel movement is usually instinctive.  Sometimes people can have problems with these muscles and have to relearn proper defecation (emptying) techniques.  If you have weakness in your muscles, organs that are falling out, decreased sensation in your pelvis, or ignore your urge to go, you may find yourself straining to have a bowel movement.  You are straining if you are: . holding your breath or taking in a huge gulp of air and holding it  . keeping your lips and jaw tensed and closed tightly . turning red in the face because of excessive pushing or forcing . developing or worsening your  hemorrhoids . getting faint while pushing . not emptying completely and have to defecate many times a day  If you are straining, you are actually making it harder for yourself to have a bowel movement.  Many people find they are pulling up with the pelvic floor muscles and closing off instead of opening the anus. Due to lack pelvic floor relaxation and coordination the abdominal muscles, one  has to work harder to push the feces out.  Many people have never been taught how to defecate efficiently and effectively.  Notice what happens to your body when you are having a bowel movement.  While you are sitting on the toilet pay attention to the following areas: . Jaw and mouth position . Angle of your hips   . Whether your feet touch the ground or not . Arm placement  . Spine position . Waist . Belly tension . Anus  (opening of the anal canal)  An Evacuation/Defecation Plan   Here are the 4 basic points:  1. Lean forward enough for your elbows to rest on your knees 2. Support your feet on the floor or use a low stool if your feet don't touch the floor  3. Push out your belly as if you have swallowed a beach ball-you should feel a widening of your waist 4. Open and relax your pelvic floor muscles, rather than tightening around the anus      The following conditions my require modifications to your toileting posture:  . If you have had surgery in the past that limits your back, hip, pelvic, knee or ankle flexibility . Constipation   Your healthcare practitioner may make the following additional suggestions and adjustments:  1) Sit on the toilet  a) Make sure your feet are supported. b) Notice your hip angle and spine position-most people find it effective to lean forward or raise their knees, which can help the muscles around the anus to relax  c) When you lean forward, place your forearms on your thighs for support  2) Relax suggestions a) Breath deeply in through your nose and out slowly through your mouth as if you are smelling the flowers and blowing out the candles. b) To become aware of how to relax your muscles, contracting and releasing muscles can be helpful.  Pull your pelvic floor muscles in tightly by using the image of holding back gas, or closing around the anus (visualize making a circle smaller) and lifting the anus up and in.  Then release the muscles and your anus should drop down and feel open. Repeat 5 times ending with the feeling of relaxation. c) Keep your pelvic floor muscles relaxed; let your belly bulge out. d) The digestive tract starts at the mouth and ends at the anal opening, so be sure to relax both ends of the tube.  Place your tongue on the roof of your mouth with your teeth separated.  This helps relax your mouth and will help to relax the anus at the same  time.  3) Empty (defecation) a) Keep your pelvic floor and sphincter relaxed, then bulge your anal muscles.  Make the anal opening wide.  b) Stick your belly out as if you have swallowed a beach ball. c) Make your belly wall hard using your belly muscles while continuing to breathe. Doing this makes it easier to open your anus. d) Breath out and give a grunt (or try using other sounds such as ahhhh, shhhhh, ohhhh or grrrrrrr).  4) Finish a) As you finish your bowel movement, pull the pelvic floor muscles up and in.  This will leave your anus in the proper place rather than remaining pushed out and down. If you leave your anus pushed out and down, it will start to feel as though that is normal and give you incorrect signals about needing to have a bowel movement.    Sherri Wilson, PT [facility] [address] [place], [place] [address]

## 2018-07-25 ENCOUNTER — Ambulatory Visit: Payer: 59 | Attending: Obstetrics and Gynecology | Admitting: Physical Therapy

## 2018-07-25 ENCOUNTER — Encounter: Payer: Self-pay | Admitting: Physical Therapy

## 2018-07-25 DIAGNOSIS — M6281 Muscle weakness (generalized): Secondary | ICD-10-CM | POA: Diagnosis not present

## 2018-07-25 DIAGNOSIS — R279 Unspecified lack of coordination: Secondary | ICD-10-CM | POA: Diagnosis not present

## 2018-07-25 NOTE — Patient Instructions (Signed)
Access Code: [email or web address]  URL: [email or web address]  Date: 07/25/2018  Prepared by: Earlie Counts   Exercises  Supine Double Knee to Chest - 1 reps - 1 sets - 30 sec hold - 1x daily - 7x weekly  Supine Lower Trunk Rotation - 2 reps - 1 sets - 30 hold - 1x daily - 7x weekly  Supine Pelvic Floor Stretch - 1 reps - 1 sets - 1 min hold - 1x daily - 7x weekly  V Sit Hip Adductor Hamstring Stretch - 1 reps - 1 sets - 30 seconds hold - 1x daily - 7x weekly  Supine Diaphragmatic Breathing with Pelvic Floor Lengthening - 10 reps - 1 sets - 1x daily - 7x weekly  Cobra - 3 reps - 1 sets - 10 sec hold - 1x daily - 7x weekly  Cat Cow - 10 reps - 1 sets - 1x daily - 7x weekly  Child's Pose Stretch - 1 reps - 1 sets - 30 sec hold - 1x daily - 7x weekly  Child's Pose with Sidebending - 1 reps - 1 sets - 30 sec hold - 1x daily - 7x weekly  Seated Hamstring Stretch - 1 reps - 1 sets - 30 sec hold - 1x daily - 7x weekly  Seated Piriformis Stretch with Trunk Bend - 1 reps - 1 sets - 30 sec hold - 1x daily - 7x weekly  [facility] [address] [place], [place] [address] Phone # [phone] Fax [phone]

## 2018-07-25 NOTE — Therapy (Signed)
[facility] [address], [place], [place], [address] Phone: [phone]   Fax:  [phone]  Physical Therapy Treatment  Patient Details  Name: Sherri Wilson MRN: [id number] Date of Birth: October 16, 1961 Referring Provider: Dr. Molli Posey   Encounter Date: 07/25/2018  PT End of Session - 07/25/18 0934    Visit Number  2    Date for PT Re-Evaluation  10/15/18    Authorization Type  UMR    PT Start Time  0930    PT Stop Time  1010    PT Time Calculation (min)  40 min    Activity Tolerance  Patient tolerated treatment well    Behavior During Therapy  [facility] for tasks assessed/performed       Past Medical History:  Diagnosis Date  . GERD (gastroesophageal reflux disease)   . Kidney stones     Past Surgical History:  Procedure Laterality Date  . EXTERNAL EAR SURGERY Left    plastic surgery, bitten by a horse    There were no vitals filed for this visit.  Subjective Assessment - 07/25/18 0933    Subjective  I am doing well.     Patient Stated Goals  reduce urinary leakage    Currently in Pain?  No/denies                       [facility] Adult PT Treatment/Exercise - 07/25/18 0001      Neuro Re-ed    Neuro Re-ed Details   working on diaphragmatic breathing  to improve motion of the pelvic floor with tactile cues and verbal cues to not raise the chest      Manual Therapy   Manual Therapy  Soft tissue mobilization    Soft tissue mobilization  circular massage to the abdoment to improve peristalic motion of the intestines, soft tissue work to the [employer],             PT Education - 07/25/18 1005    Education Details  Access Code: [email or web address]     Person(s) Educated  Patient    Methods  Explanation;Demonstration;Verbal cues;Handout    Comprehension  Returned demonstration;Verbalized understanding       PT Short Term Goals - 07/23/18 1209      PT SHORT TERM GOAL #1   Title   independent with initial HEP    Time  4    Period  Weeks    Status  New    Target Date  08/20/18      PT SHORT TERM GOAL #2   Title  ability to perform diaphgramatic breathing to improve mobility of pelvic floor    Time  4    Period  Weeks    Status  New    Target Date  08/20/18      PT SHORT TERM GOAL #3   Title  understand correct toileting technique to not strain the pelvic floor    Time  4    Period  Weeks    Status  New    Target Date  08/20/18      PT SHORT TERM GOAL #4   Title  understand how to perform abdominal massage to improve motility of the intestines and bowel movements    Time  4    Period  Weeks    Status  New    Target Date  08/20/18        PT Long Term Goals -  07/23/18 1215      PT LONG TERM GOAL #1   Title  independent with HEP and understand how to progress herself    Time  12    Period  Weeks    Status  New    Target Date  10/15/18      PT LONG TERM GOAL #2   Title  urinary leakage is minimal to none with horseback riding, running, and exercise due to improve coordination of the pelvic floor    Time  12    Period  Weeks    Status  New    Target Date  07/30/18      PT LONG TERM GOAL #3   Title  fecal leakage is none with traveling due to understanding how to reduce constipation    Time  12    Period  Weeks    Status  New    Target Date  10/15/18      PT LONG TERM GOAL #4   Title  pelvic floor strength >/= 4/5 without contracting the gluteals     Time  12    Period  Weeks    Status  New    Target Date  10/15/18            Plan - 07/25/18 1013    Clinical Impression Statement  Patient is able to raise her abdomen a little prior to her chest raising but requires many verbal cues. Patient has tight hip adductors.  Patient continues to have urinary leakage but is learning to relax the pelvic floor .  Patient will benefit from skilled therapy to improve pelvic floor coordination, lengthen the muscles and reduce leakage.     Rehab  Potential  Excellent    Clinical Impairments Affecting Rehab Potential  none    PT Frequency  1x / week    PT Duration  12 weeks    PT Treatment/Interventions  Biofeedback;Therapeutic activities;Therapeutic exercise;Patient/family education;Neuromuscular re-education;Manual techniques;Energy conservation    PT Next Visit Plan  Pelvic floor EMG; diaphgramatic breathing, pelvic floor soft tissue work,, lumbar, correct pelvis, vaginal moisturizers    PT Home Exercise Plan  Access Code: [email or web address]     Consulted and Agree with Plan of Care  Patient       Patient will benefit from skilled therapeutic intervention in order to improve the following deficits and impairments:  Increased fascial restricitons, Increased muscle spasms, Decreased activity tolerance, Decreased range of motion, Decreased strength, Impaired flexibility  Visit Diagnosis: Unspecified lack of coordination  Muscle weakness (generalized)     Problem List Patient Active Problem List   Diagnosis Date Noted  . 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 Dyspepsia and other specified disorders of function of stomach 11/26/2012  . Special screening for malignant neoplasms, colon 11/26/2012  . Healthcare maintenance 09/20/2011  . 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 NECK PAIN 11/15/2010  . ROTATOR CUFF SYNDROME 11/15/2010    Earlie Counts, PT 07/25/18 10:17 AM   [facility] [address], [place], [place], [address] Phone: [phone]   Fax:  [phone]  Name: Sherri Wilson MRN: [id number] Date of Birth: January 05, 1961

## 2018-08-08 ENCOUNTER — Ambulatory Visit: Payer: 59 | Admitting: Physical Therapy

## 2018-08-08 ENCOUNTER — Encounter: Payer: Self-pay | Admitting: Physical Therapy

## 2018-08-08 DIAGNOSIS — R279 Unspecified lack of coordination: Secondary | ICD-10-CM | POA: Diagnosis not present

## 2018-08-08 DIAGNOSIS — M6281 Muscle weakness (generalized): Secondary | ICD-10-CM | POA: Diagnosis not present

## 2018-08-08 NOTE — Patient Instructions (Addendum)
Moisturizers . They are used in the vagina to hydrate the mucous membrane that make up the vaginal canal. . Designed to keep a more normal acid balance (ph) . Once placed in the vagina, it will last between two to three days.  . Use 2-3 times per week at bedtime and last longer than 60 min. . Ingredients to avoid is glycerin and fragrance, can increase chance of infection . Should not be used just before sex due to causing irritation . Most are gels administered either in a tampon-shaped applicator or as a vaginal suppository. They are non-hormonal.   Types of Moisturizers . Samul Dada- drug store . Vitamin E vaginal suppositories- Whole foods, Amazon . Moist Again . Coconut oil- can break down condoms . Julva- (Do no use if on Tamoxifen) amazon . Yes moisturizer- amazon . NeuEve Silk , NeuEve Silver for menopausal or over 65 (if have severe vaginal atrophy or cancer treatments use NeuEve Silk for  1 month than move to [employer])- [employer], MapleFlower.dk . Olive and Bee intimate cream- www.oliveandbee.com.au . Mae vaginal moisturizer- Amazon  Creams to use externally on the Vulva area  Albertson's (good for for cancer patients that had radiation to the area)- [place] or [employer].FlyingBasics.com.br  V-magic cream - amazon  Julva-amazon  Vital "V Wild Yam salve ( help moisturize and help with thinning vulvar area, does have [place] by Damiva labial moisturizer ([place],    Things to avoid in the vaginal area . Do not use things to irritate the vulvar area . No lotions just specialized creams for the vulva area- Neogyn, V-magic, No soaps; can use Aveeno or Calendula cleanser if needed. Must be gentle . No deodorants . No douches . Good to sleep without underwear to let the vaginal area to air out . No scrubbing: spread the lips to let warm water rinse over labias and pat dry   Lubrication . Used  for intercourse to reduce friction . Avoid ones that have glycerin, warming gels, tingling gels, icing or cooling gel, scented . Avoid parabens due to a preservative similar to female sex hormone . May need to be reapplied once or several times during sexual activity . Can be applied to both partners genitals prior to vaginal penetration to minimize friction or irritation . Prevent irritation and mucosal tears that cause post coital pain and increased the risk of vaginal and urinary tract infections . Oil-based lubricants cannot be used with condoms due to breaking them down.  Least likely to irritate vaginal tissue.  . Plant based-lubes are safe . Silicone-based lubrication are thicker and last long and used for post-menopausal women  Vaginal Lubricators Here is a list of some suggested lubricators you can use for intercourse. Use the most hypoallergenic product.  You can place on you or your partner.   Slippery Stuff  Sylk or Sliquid Natural H2O ( good  if frequent UTI's)  Blossom Organics (www.blossom-organics.com)  Luvena   Coconut oil  PJur Woman Nude- water based lubricant, amazon  Uberlube- Amazon  Aloe Vera  Yes lubricant- [employer] Platinum-Silicone, Target, Walgreens  Olive and Bee intimate cream-  www.oliveandbee.com.au Things to avoid in lubricants are glycerin, warming gels, tingling gels, icing or cooling  gels, and scented gels.  Also avoid Vaseline. KY jelly, Replens, and Astroglide kills good bacteria(lactobacilli)   Things to avoid in the vaginal area . Do not use things to irritate  the vulvar area . No lotions- see below . No soaps; can use Aveeno or Calendula cleanser if needed. Must be gentle . No deodorants . No douches . Good to sleep without underwear to let the vaginal area to air out . No scrubbing: spread the lips to let warm water rinse over labias and pat dry  Creams that can be used on the [place] [employer] or [facility] [address] [place], [place] [address] Phone # [phone] Fax [phone]   Slow Contraction: Gravity Eliminated (Hook-Lying)    Lie with hips and knees bent. Slowly squeeze pelvic floor for 5___ seconds. Rest for _10__ seconds. Repeat __10_ times. Do __3_ times a day.   Copyright  VHI. All rights reserved.

## 2018-08-08 NOTE — Therapy (Signed)
[facility] [address], [place], [place], [address] Phone: [phone]   Fax:  [phone]  Physical Therapy Treatment  Patient Details  Name: Sherri Wilson MRN: [id number] Date of Birth: 1961-09-30 Referring Provider: Dr. Molli Posey   Encounter Date: 08/08/2018  PT End of Session - 08/08/18 1015    Visit Number  3    Date for PT Re-Evaluation  10/15/18    Authorization Type  UMR    PT Start Time  0845    PT Stop Time  0930    PT Time Calculation (min)  45 min    Activity Tolerance  Patient tolerated treatment well    Behavior During Therapy  [facility] for tasks assessed/performed       Past Medical History:  Diagnosis Date  . GERD (gastroesophageal reflux disease)   . Kidney stones     Past Surgical History:  Procedure Laterality Date  . EXTERNAL EAR SURGERY Left    plastic surgery, bitten by a horse    There were no vitals filed for this visit.  Subjective Assessment - 08/08/18 0850    Subjective  I have not had fecal leakage since evaluation.  I am doing the stretches.  No change in the urinary leakage. I notice the leakage with activities. I will leak with a faster trot on the horse.     Patient Stated Goals  reduce urinary leakage    Currently in Pain?  No/denies    Multiple Pain Sites  No                    Pelvic Floor Special Questions - 08/08/18 0001    Pelvic Floor Internal Exam  Patient confirms identification and approves PT to assess pelvic floor and treat    Exam Type  Vaginal    Strength  weak squeeze, no lift        [facility] Adult PT Treatment/Exercise - 08/08/18 0001      Self-Care   Self-Care  Other Self-Care Comments    Other Self-Care Comments   information on vaginal moisturizers and lubricants for good vaginal health      Neuro Re-ed    Neuro Re-ed Details   taping of the pelvic floor muscles to improve circular contraction especially on the sides in hookly       Manual Therapy   Manual Therapy  Internal Pelvic Floor    Soft tissue mobilization  bilateral urethra sphincter, along the intoitus, obturator internist             PT Education - 08/08/18 0931    Education Details  information on vaginal moisturizers and lubricants; pelvic floor contraction    Person(s) Educated  Patient    Methods  Explanation;Demonstration;Verbal cues;Handout    Comprehension  Verbalized understanding;Returned demonstration       PT Short Term Goals - 08/08/18 1017      PT SHORT TERM GOAL #1   Title  independent with initial HEP    Time  4    Period  Weeks    Status  Achieved      PT SHORT TERM GOAL #2   Title  ability to perform diaphgramatic breathing to improve mobility of pelvic floor    Time  4    Period  Weeks    Status  On-going      PT SHORT TERM GOAL #3   Title  understand correct toileting technique to not strain the  pelvic floor    Time  4    Period  Weeks    Status  On-going      PT SHORT TERM GOAL #4   Title  understand how to perform abdominal massage to improve motility of the intestines and bowel movements    Time  4    Period  Weeks    Status  Achieved        PT Long Term Goals - 07/23/18 1215      PT LONG TERM GOAL #1   Title  independent with HEP and understand how to progress herself    Time  12    Period  Weeks    Status  New    Target Date  10/15/18      PT LONG TERM GOAL #2   Title  urinary leakage is minimal to none with horseback riding, running, and exercise due to improve coordination of the pelvic floor    Time  12    Period  Weeks    Status  New    Target Date  07/30/18      PT LONG TERM GOAL #3   Title  fecal leakage is none with traveling due to understanding how to reduce constipation    Time  12    Period  Weeks    Status  New    Target Date  10/15/18      PT LONG TERM GOAL #4   Title  pelvic floor strength >/= 4/5 without contracting the gluteals     Time  12    Period  Weeks     Status  New    Target Date  10/15/18            Plan - 08/08/18 1015    Clinical Impression Statement  Patient will leak urine with activities.  Patient will leak urine when her horse is galloping or fast walk with english style riding.  Patient pelvic floor contraction is more anterior and posterior and needs tapping of the muscles for circular contraction.  Patient understands good vaginal health with lubricants and moisturizers. Patient will benefit fromskilled therapy to improve pelvic floor coordination , lengthen muscles and reduce leakage.     Rehab Potential  Excellent    Clinical Impairments Affecting Rehab Potential  none    PT Frequency  1x / week    PT Duration  12 weeks    PT Treatment/Interventions  Biofeedback;Therapeutic activities;Therapeutic exercise;Patient/family education;Neuromuscular re-education;Manual techniques;Energy conservation    PT Next Visit Plan  Pelvic floor EMG; pelvic floor soft tissue work,, lumbar, correct pelvis,     PT Home Exercise Plan  Access Code: [email or web address]     Recommended Other Services  MD signed initial eval    Consulted and Agree with Plan of Care  Patient       Patient will benefit from skilled therapeutic intervention in order to improve the following deficits and impairments:  Increased fascial restricitons, Increased muscle spasms, Decreased activity tolerance, Decreased range of motion, Decreased strength, Impaired flexibility  Visit Diagnosis: Unspecified lack of coordination  Muscle weakness (generalized)     Problem List Patient Active Problem List   Diagnosis Date Noted  . 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 Dyspepsia and other specified disorders of function of stomach 11/26/2012  . Special screening for malignant neoplasms, colon 11/26/2012  . Healthcare maintenance 09/20/2011  . 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 NECK PAIN 11/15/2010  . ROTATOR CUFF SYNDROME 11/15/2010  Earlie Counts, PT 08/08/18  10:19 AM    [facility] [address] [place], [place], [address] Phone: [phone]   Fax:  [phone]  Name: Sherri Wilson MRN: [id number] Date of Birth: 03-22-1961

## 2018-08-12 ENCOUNTER — Ambulatory Visit: Payer: 59 | Admitting: Physical Therapy

## 2018-08-12 ENCOUNTER — Encounter: Payer: Self-pay | Admitting: Physical Therapy

## 2018-08-12 DIAGNOSIS — R279 Unspecified lack of coordination: Secondary | ICD-10-CM

## 2018-08-12 DIAGNOSIS — M6281 Muscle weakness (generalized): Secondary | ICD-10-CM | POA: Diagnosis not present

## 2018-08-12 NOTE — Patient Instructions (Addendum)
Quick Contraction: Gravity Resisted (Sitting)    Sitting, quickly squeeze then fully relax pelvic floor. Perform _5__ sets of _5__. Rest for _5__ seconds between sets. Do _1 times a day. Do while sitting on the horse Copyright  VHI. All rights reserved.  When you rise on the horse exhale  Slow Contraction: Gravity Resisted (Sitting)    Sitting, slowly squeeze pelvic floor for __5_ seconds. Rest for __5_ seconds. Repeat _5__ times for 2 sets . Do _1__ times a day. While on horse.   Copyright  VHI. All rights reserved.  [facility] [address], [place], [place] [address] Phone # [phone] Fax [phone]

## 2018-08-12 NOTE — Therapy (Signed)
[facility] [address] [place], [place], [address] Phone: [phone]   Fax:  [phone]  Physical Therapy Treatment  Patient Details  Name: Sherri Wilson MRN: [id number] Date of Birth: [birth year] Referring Provider: Dr. Molli Posey   Encounter Date: 08/12/2018  PT End of Session - 08/12/18 1143    Visit Number  4    Date for PT Re-Evaluation  10/15/18    Authorization Type  UMR    PT Start Time  1100    PT Stop Time  1144    PT Time Calculation (min)  44 min    Activity Tolerance  Patient tolerated treatment well    Behavior During Therapy  [facility] for tasks assessed/performed       Past Medical History:  Diagnosis Date  . GERD (gastroesophageal reflux disease)   . Kidney stones     Past Surgical History:  Procedure Laterality Date  . EXTERNAL EAR SURGERY Left    plastic surgery, bitten by a horse    There were no vitals filed for this visit.  Subjective Assessment - 08/12/18 1105    Subjective  I have been using the packets of the lubricants on the vulva area. I have been moisturizer nightly.     Patient Stated Goals  reduce urinary leakage    Currently in Pain?  No/denies    Multiple Pain Sites  No                    Pelvic Floor Special Questions - 08/12/18 0001    Biofeedback  sitting rest level 2 uv, contract to 10-uv for holding, quick onctraction 15-20 uv, sit on pummel like a horse and post hold 5 sec with breathing    Biofeedback sensor type  Surface   vaginal               PT Education - 08/12/18 1142    Education Details  pelvic floor contraction with facilitating sitting on a horse    Person(s) Educated  Patient    Methods  Explanation;Demonstration;Verbal cues;Handout    Comprehension  Returned demonstration;Verbalized understanding       PT Short Term Goals - 08/12/18 1108      PT SHORT TERM GOAL #2   Title  ability to perform diaphgramatic  breathing to improve mobility of pelvic floor    Time  4    Period  Weeks    Status  Achieved      PT SHORT TERM GOAL #3   Title  understand correct toileting technique to not strain the pelvic floor    Time  4    Period  Weeks    Status  Achieved        PT Long Term Goals - 07/23/18 1215      PT LONG TERM GOAL #1   Title  independent with HEP and understand how to progress herself    Time  12    Period  Weeks    Status  New    Target Date  10/15/18      PT LONG TERM GOAL #2   Title  urinary leakage is minimal to none with horseback riding, running, and exercise due to improve coordination of the pelvic floor    Time  12    Period  Weeks    Status  New    Target Date  07/30/18      PT LONG TERM GOAL #3  Title  fecal leakage is none with traveling due to understanding how to reduce constipation    Time  12    Period  Weeks    Status  New    Target Date  10/15/18      PT LONG TERM GOAL #4   Title  pelvic floor strength >/= 4/5 without contracting the gluteals     Time  12    Period  Weeks    Status  New    Target Date  10/15/18            Plan - 08/12/18 1144    Clinical Impression Statement  Resting level in sitting is 2 uv.  Patient is able to sit and quickly contract to 20 uv but is engaging the pelvic floor muscles.  Patient was sitting on a knee rest to facilitate sitting on a horse with quick contraction and hold 5 seconds with breath.  Patient is able to hold 8 uv for 5 seconds but longer will hold her breath.  Patient has met her STG's. Patient will benefi tfrom skilled therapy to improve pelvic floor coordination, lengthen muscles and reduce leakage.     Clinical Presentation  Stable    Clinical Decision Making  Low    Rehab Potential  Excellent    Clinical Impairments Affecting Rehab Potential  none    PT Frequency  1x / week    PT Duration  12 weeks    PT Treatment/Interventions  Biofeedback;Therapeutic activities;Therapeutic  exercise;Patient/family education;Neuromuscular re-education;Manual techniques;Energy conservation    PT Next Visit Plan  Pelvic floor EMG work with activity; pelvic floor soft tissue work,, lumbar, correct pelvis,     PT Home Exercise Plan  Access Code: [email or web address]     Consulted and Agree with Plan of Care  Patient       Patient will benefit from skilled therapeutic intervention in order to improve the following deficits and impairments:  Increased fascial restricitons, Increased muscle spasms, Decreased activity tolerance, Decreased range of motion, Decreased strength, Impaired flexibility  Visit Diagnosis: Unspecified lack of coordination  Muscle weakness (generalized)     Problem List Patient Active Problem List   Diagnosis Date Noted  . 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 Dyspepsia and other specified disorders of function of stomach 11/26/2012  . Special screening for malignant neoplasms, colon 11/26/2012  . Healthcare maintenance 09/20/2011  . 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 NECK PAIN 11/15/2010  . ROTATOR CUFF SYNDROME 11/15/2010    Earlie Counts, PT 08/12/18 11:47 AM   [facility] [address] [place], [place], [address] Phone: [phone]   Fax:  [phone]  Name: Sherri Wilson MRN: [id number] Date of Birth: [birth year]

## 2018-09-02 DIAGNOSIS — H5203 Hypermetropia, bilateral: Secondary | ICD-10-CM | POA: Diagnosis not present

## 2018-09-02 DIAGNOSIS — H524 Presbyopia: Secondary | ICD-10-CM | POA: Diagnosis not present

## 2018-09-10 ENCOUNTER — Ambulatory Visit: Payer: 59 | Attending: Obstetrics and Gynecology | Admitting: Physical Therapy

## 2018-09-10 ENCOUNTER — Encounter: Payer: Self-pay | Admitting: Physical Therapy

## 2018-09-10 DIAGNOSIS — R279 Unspecified lack of coordination: Secondary | ICD-10-CM

## 2018-09-10 DIAGNOSIS — M6281 Muscle weakness (generalized): Secondary | ICD-10-CM | POA: Diagnosis not present

## 2018-09-10 NOTE — Therapy (Signed)
[facility] [address], [place], [place], [address] Phone: [phone]   Fax:  [phone]  Physical Therapy Treatment  Patient Details  Name: Sherri Wilson MRN: [id number] Date of Birth: [birth year] Referring Provider: Dr. Molli Posey   Encounter Date: 09/10/2018  PT End of Session - 09/10/18 1318    Visit Number  5    Date for PT Re-Evaluation  10/15/18    Authorization Type  UMR    PT Start Time  1230    PT Stop Time  1308    PT Time Calculation (min)  38 min    Activity Tolerance  Patient tolerated treatment well    Behavior During Therapy  [facility] for tasks assessed/performed       Past Medical History:  Diagnosis Date  . GERD (gastroesophageal reflux disease)   . Kidney stones     Past Surgical History:  Procedure Laterality Date  . EXTERNAL EAR SURGERY Left    plastic surgery, bitten by a horse    There were no vitals filed for this visit.  Subjective Assessment - 09/10/18 1233    Subjective  I have missed several weeks due to being out of town.  I still have some urine leakage.     Patient Stated Goals  reduce urinary leakage    Currently in Pain?  No/denies    Multiple Pain Sites  No         [facility] PT Assessment - 09/10/18 0001      Strength   Left Hip ABduction  4/5      Palpation   SI assessment   right ilium is anteriorly rotated; sacrum rotated left                   [facility] Adult PT Treatment/Exercise - 09/10/18 0001      Self-Care   Self-Care  Other Self-Care Comments    Other Self-Care Comments   using extra virgin olive oil      Neuro Re-ed    Neuro Re-ed Details   diaphgramatic breathing in supine; pelvic floor contraction with squats; pelvic floor ocntraction with lunges      Knee/Hip Exercises: Aerobic   Other Aerobic  jump on the trampoline with pelvic floor contraction      Knee/Hip Exercises: Sidelying   Hip ABduction  Left;2 sets;10  reps;Strengthening   with pelvic floor contraction     Manual Therapy   Manual Therapy  Joint mobilization;Muscle Energy Technique    Joint Mobilization  rotational mobilization to L2-L5 grade 3    Muscle Energy Technique  correct left ilium in prone             PT Education - 09/10/18 1318    Education Details  Access Code: [email or web address]    Person(s) Educated  Patient    Methods  Explanation;Demonstration;Verbal cues;Handout    Comprehension  Returned demonstration;Verbalized understanding       PT Short Term Goals - 08/12/18 1108      PT SHORT TERM GOAL #2   Title  ability to perform diaphgramatic breathing to improve mobility of pelvic floor    Time  4    Period  Weeks    Status  Achieved      PT SHORT TERM GOAL #3   Title  understand correct toileting technique to not strain the pelvic floor    Time  4    Period  Weeks    Status  Achieved  PT Long Term Goals - 09/10/18 1322      PT LONG TERM GOAL #1   Title  independent with HEP and understand how to progress herself    Baseline  still learning    Time  12    Period  Weeks    Status  On-going      PT LONG TERM GOAL #2   Title  urinary leakage is minimal to none with horseback riding, running, and exercise due to improve coordination of the pelvic floor    Time  12    Period  Weeks    Status  On-going      PT LONG TERM GOAL #3   Title  fecal leakage is none with traveling due to understanding how to reduce constipation    Time  12    Period  Weeks    Status  On-going      PT LONG TERM GOAL #4   Title  pelvic floor strength >/= 4/5 without contracting the gluteals     Time  12    Period  Weeks    Status  On-going            Plan - 09/10/18 1238    Clinical Impression Statement  Patient has learned how to perform eccentric contraction of the pelvic floor and breathing correctly to decrease strain on the pelvic floor. Patient reports she has not been running or jumping yet.  Patient was  able to jump on the trampoline with pelvic floor contraction and breath without urinary leakage.  Patient will benefit from skilled therapy to improve pelvic floor coordination, lengthen muscles and reduce leakage.     Rehab Potential  Excellent    Clinical Impairments Affecting Rehab Potential  none    PT Frequency  1x / week    PT Duration  12 weeks    PT Treatment/Interventions  Biofeedback;Therapeutic activities;Therapeutic exercise;Patient/family education;Neuromuscular re-education;Manual techniques;Energy conservation    PT Next Visit Plan  Pelvic floor EMG work with activity; running; hold pelvic floor contraction 15 seconds    PT Home Exercise Plan  Access Code: [email or web address]     Consulted and Agree with Plan of Care  Patient       Patient will benefit from skilled therapeutic intervention in order to improve the following deficits and impairments:  Increased fascial restricitons, Increased muscle spasms, Decreased activity tolerance, Decreased range of motion, Decreased strength, Impaired flexibility  Visit Diagnosis: Unspecified lack of coordination  Muscle weakness (generalized)     Problem List Patient Active Problem List   Diagnosis Date Noted  . 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 Dyspepsia and other specified disorders of function of stomach 11/26/2012  . Special screening for malignant neoplasms, colon 11/26/2012  . Healthcare maintenance 09/20/2011  . 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 NECK PAIN 11/15/2010  . ROTATOR CUFF SYNDROME 11/15/2010    Earlie Counts, PT 09/10/18 1:24 PM   [facility] [address], [place], [place], [address] Phone: [phone]   Fax:  [phone]  Name: Miquela D Wilson MRN: [id number] Date of Birth: 08-28-1961

## 2018-09-10 NOTE — Patient Instructions (Signed)
Access Code: [email or web address] Sidelying Hip Abduction - 10 reps - 1 sets - 1x daily - 7x weekly  Sidelying Hip Circles - 10 reps - 2 sets - 1x daily - 7x weekly  Lunge with Pelvic Floor Contraction - 5 reps - 1 sets - 1x daily - 7x weekly  Mini Squat with Pelvic Floor Contraction - 5 reps - 1 sets - 1x daily - 7x weekly  [facility] [address] [place], [place] [address] Phone # [phone] Fax [phone]

## 2018-09-18 ENCOUNTER — Ambulatory Visit: Payer: 59 | Admitting: Physical Therapy

## 2018-09-18 ENCOUNTER — Encounter: Payer: Self-pay | Admitting: Physical Therapy

## 2018-09-18 DIAGNOSIS — R279 Unspecified lack of coordination: Secondary | ICD-10-CM

## 2018-09-18 DIAGNOSIS — M6281 Muscle weakness (generalized): Secondary | ICD-10-CM

## 2018-09-18 NOTE — Therapy (Signed)
[facility] [address] [place], [place], [address] Phone: [phone]   Fax:  [phone]  Physical Therapy Treatment  Patient Details  Name: Sherri Wilson MRN: [id number] Date of Birth: August 31, 1961 Referring Provider: Dr. Molli Posey   Encounter Date: 09/18/2018  PT End of Session - 09/18/18 0851    Visit Number  6    Date for PT Re-Evaluation  10/15/18    Authorization Type  UMR    PT Start Time  0845    PT Stop Time  0925    PT Time Calculation (min)  40 min    Activity Tolerance  Patient tolerated treatment well    Behavior During Therapy  [facility] for tasks assessed/performed       Past Medical History:  Diagnosis Date  . GERD (gastroesophageal reflux disease)   . Kidney stones     Past Surgical History:  Procedure Laterality Date  . EXTERNAL EAR SURGERY Left    plastic surgery, bitten by a horse    There were no vitals filed for this visit.  Subjective Assessment - 09/18/18 0849    Subjective  Yesterday I rode and had alot of leakage.     Patient Stated Goals  reduce urinary leakage    Currently in Pain?  No/denies                    Pelvic Floor Special Questions - 09/18/18 0001    Pelvic Floor Internal Exam  Patient confirms identification and approves PT to assess pelvic floor and treat    Exam Type  Vaginal    Strength  weak squeeze, no lift   circular       [facility] Adult PT Treatment/Exercise - 09/18/18 0001      Neuro Re-ed    Neuro Re-ed Details   tapping of the sphincter muscles on the side to improve circular contraction and not compensating with the gluteals and hip adductors; diaphragmatic breathing with expansion of the pelvic floor      Lumbar Exercises: Seated   Other Seated Lumbar Exercises  sit on green physioball-pelvic sway, pelvic circles, diagonals   focus on shoulder relaxation and decrease tension in cervica     Manual Therapy   Manual Therapy   Internal Pelvic Floor    Internal Pelvic Floor  left levator ani, bilateral bulbocavernosus, bil. urethra sphincter, along the sphincter muscle             PT Education - 09/18/18 0927    Education Details  pelvic floor contraction isolation; diaphgramatic breathing    Person(s) Educated  Patient    Methods  Explanation;Demonstration;Verbal cues;Handout    Comprehension  Returned demonstration;Verbalized understanding       PT Short Term Goals - 08/12/18 1108      PT SHORT TERM GOAL #2   Title  ability to perform diaphgramatic breathing to improve mobility of pelvic floor    Time  4    Period  Weeks    Status  Achieved      PT SHORT TERM GOAL #3   Title  understand correct toileting technique to not strain the pelvic floor    Time  4    Period  Weeks    Status  Achieved        PT Long Term Goals - 09/10/18 1322      PT LONG TERM GOAL #1   Title  independent with HEP and understand how to  progress herself    Baseline  still learning    Time  12    Period  Weeks    Status  On-going      PT LONG TERM GOAL #2   Title  urinary leakage is minimal to none with horseback riding, running, and exercise due to improve coordination of the pelvic floor    Time  12    Period  Weeks    Status  On-going      PT LONG TERM GOAL #3   Title  fecal leakage is none with traveling due to understanding how to reduce constipation    Time  12    Period  Weeks    Status  On-going      PT LONG TERM GOAL #4   Title  pelvic floor strength >/= 4/5 without contracting the gluteals     Time  12    Period  Weeks    Status  On-going            Plan - 09/18/18 2979    Clinical Impression Statement  Patient still having leakage with horseback riding.  Patient is not able to make a circular contraction of the pelvic floor and will compensate with the hip adductors and gluteals.  Patient is able to expand the lower abdominals but has trouble with the upper abdominals.  Patient pelvic  floor strength is 2/5 but after manual work and isolating the muscles is will become a light circular contraction.  Patient will benefit from skilled therapy to improve pelvic floor coordination , lengthen muscles and reduce leakage.     Rehab Potential  Excellent    Clinical Impairments Affecting Rehab Potential  none    PT Frequency  1x / week    PT Duration  12 weeks    PT Treatment/Interventions  Biofeedback;Therapeutic activities;Therapeutic exercise;Patient/family education;Neuromuscular re-education;Manual techniques;Energy conservation    PT Next Visit Plan  work on breathing, soft tissue work and tapping of the pelvic floor muscles    PT Home Exercise Plan  Access Code: [email or web address]     Consulted and Agree with Plan of Care  Patient       Patient will benefit from skilled therapeutic intervention in order to improve the following deficits and impairments:  Increased fascial restricitons, Increased muscle spasms, Decreased activity tolerance, Decreased range of motion, Decreased strength, Impaired flexibility  Visit Diagnosis: Unspecified lack of coordination  Muscle weakness (generalized)     Problem List Patient Active Problem List   Diagnosis Date Noted  . 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 Dyspepsia and other specified disorders of function of stomach 11/26/2012  . Special screening for malignant neoplasms, colon 11/26/2012  . Healthcare maintenance 09/20/2011  . 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 NECK PAIN 11/15/2010  . ROTATOR CUFF SYNDROME 11/15/2010    Earlie Counts, PT 09/18/18 9:32 AM   [facility] [address] [place], [place], [address] Phone: [phone]   Fax:  [phone]  Name: Sherri Wilson MRN: [id number] Date of Birth: 1961-07-06

## 2018-09-18 NOTE — Patient Instructions (Addendum)
Physioball size is 65 cm  Or 26 inches  Slow Contraction: Gravity Eliminated (Hook-Lying)    Lie with hips and knees bent. Slowly squeeze pelvic floor for _5__ seconds. Rest for _10__ seconds. Repeat _5__ times. Do __3_ times a day.   Copyright  VHI. All rights reserved.    Hook-Lying    Lie with hips and knees bent. Allow body's muscles to relax. Place hands on belly. Inhale slowly and deeply for __3_ seconds, so hands move up. Then take _3__ seconds to exhale. Repeat _5__ times. Do _3_ times a day.   Copyright  VHI. All rights reserved.  Guided meditation Mind space app Progressive contraction of the muscles and relax them  [facility] [address] [place], [place] [address] Phone # [phone] Fax [phone]

## 2018-10-03 DIAGNOSIS — D692 Other nonthrombocytopenic purpura: Secondary | ICD-10-CM | POA: Diagnosis not present

## 2018-10-24 ENCOUNTER — Ambulatory Visit: Payer: 59 | Attending: Obstetrics and Gynecology | Admitting: Physical Therapy

## 2018-10-24 ENCOUNTER — Encounter: Payer: Self-pay | Admitting: Physical Therapy

## 2018-10-24 DIAGNOSIS — R279 Unspecified lack of coordination: Secondary | ICD-10-CM | POA: Insufficient documentation

## 2018-10-24 DIAGNOSIS — M6281 Muscle weakness (generalized): Secondary | ICD-10-CM | POA: Diagnosis not present

## 2018-10-24 NOTE — Patient Instructions (Addendum)
Slow Contraction: Gravity Resisted (Sitting)    Sitting, slowly squeeze pelvic floor for __5_ seconds. Rest for __5_ seconds. Repeat _5__ times. Do __1_ times a day. Then hold 10 sec hold 10 sec, 5 times per day Do the same thing in standing.  Copyright  VHI. All rights reserved.

## 2018-10-24 NOTE — Therapy (Signed)
[facility] [address] [place], [place], [address] Phone: [phone]   Fax:  [phone]  Physical Therapy Treatment  Patient Details  Name: Sherri Wilson MRN: [id number] Date of Birth: [birth year] Referring Provider (PT): Dr. Molli Posey   Encounter Date: 10/24/2018  PT End of Session - 10/24/18 1530    Visit Number  7    Date for PT Re-Evaluation  12/23/18    Authorization Type  UMR    PT Start Time  1400    PT Stop Time  1445    PT Time Calculation (min)  45 min    Activity Tolerance  Patient tolerated treatment well    Behavior During Therapy  [facility] for tasks assessed/performed       Past Medical History:  Diagnosis Date  . GERD (gastroesophageal reflux disease)   . Kidney stones     Past Surgical History:  Procedure Laterality Date  . EXTERNAL EAR SURGERY Left    plastic surgery, bitten by a horse    There were no vitals filed for this visit.  Subjective Assessment - 10/24/18 1403    Subjective  I have tried a new saddle and have leaked less. I have been out of town helping my parents. I am doing the exercises and moisturizers. I have made some progress. I still have a problem, I can run a little before I like. I wear a pad with riding my horse due to leakage.     Patient Stated Goals  reduce urinary leakage    Currently in Pain?  No/denies         [facility] PT Assessment - 10/24/18 0001      Assessment   Medical Diagnosis  N39.3 Stress urinary incontinence    Referring Provider (PT)  Dr. Molli Posey    Onset Date/Surgical Date  07/23/17    Prior Therapy  none      Precautions   Precautions  None      Restrictions   Weight Bearing Restrictions  No      Home Environment   Living Environment  Private residence      Prior Function   Level of Independence  Independent    Vocation  Retired    Leisure  ride horses, run, hike, gym 3x/week      Cognition   Overall Cognitive  Status  Within Functional Limits for tasks assessed      AROM   Lumbar Extension  full      Strength   Left Hip ABduction  4+/5      Palpation   SI assessment   ilium is balanced                Pelvic Floor Special Questions - 10/24/18 0001    Activities that cause leaking  Other;Running    Other activities that cause leaking  riding horse    Biofeedback  sitting hold 5 sec, resting tone 1.5 uv; hold 8 sec averaging  10 uv, hold for 10 seconds at 10 uv;  Standing_ hold 3 sec.  10 uv; hold 5 second, 10 second,     Biofeedback sensor type  Surface   vaginal   Biofeedback Activity  Other   jumping with contraction and correct breathing       [facility] Adult PT Treatment/Exercise - 10/24/18 0001      Self-Care   Self-Care  Other Self-Care Comments    Other Self-Care Comments   discussed how  her larger seat could be keeping her thighs tighter and a smaller seat will reduce the muscle tension and easier to contract the pelvic floor             PT Education - 10/24/18 1530    Education Details  longer pelvic floor contraction holds to build endurance    Person(s) Educated  Patient    Methods  Explanation;Demonstration;Verbal cues;Handout    Comprehension  Verbalized understanding;Returned demonstration       PT Short Term Goals - 08/12/18 1108      PT SHORT TERM GOAL #2   Title  ability to perform diaphgramatic breathing to improve mobility of pelvic floor    Time  4    Period  Weeks    Status  Achieved      PT SHORT TERM GOAL #3   Title  understand correct toileting technique to not strain the pelvic floor    Time  4    Period  Weeks    Status  Achieved        PT Long Term Goals - 10/24/18 1405      PT LONG TERM GOAL #1   Title  independent with HEP and understand how to progress herself    Baseline  still learning    Time  12    Period  Weeks    Status  On-going      PT LONG TERM GOAL #2   Title  urinary leakage is minimal to none with  horseback riding, running, and exercise due to improve coordination of the pelvic floor    Time  12    Status  On-going      PT LONG TERM GOAL #3   Title  fecal leakage is none with traveling due to understanding how to reduce constipation    Time  12    Period  Weeks    Status  On-going      PT LONG TERM GOAL #4   Title  pelvic floor strength >/= 4/5 without contracting the gluteals     Time  12    Period  Weeks    Status  On-going            Plan - 10/24/18 1531    Clinical Impression Statement  Patient is now able to run a few minutes without leaking urine for the first time. Pateint was able to jump in therapy with correct breathing technique and no urine leakage. Patient has difficulty with holding pelvic floor contraction longer than 10 second due to decreased endurance. Patient has full lumbar ROM. Patient hip abduction strength is 5/5. Patient is able to relax the pelvic floor to 1.5 uv in sitting. When in standing she had some difficulty with pelvic floor relaxation and had to think about it. Patient would compensate with hip muscle when holding pelvic floor contraction longer than 10 seconds.  Patient still leaks with riding a horse but will be trying a smaller saddle that will put the hip adductors on less of a stretch. Patient will benefit from skilled therapy to  improve pelvic floor coordination, lengthe muscles and reduce leakage.     Rehab Potential  Excellent    Clinical Impairments Affecting Rehab Potential  none    PT Frequency  1x / week    PT Duration  Other (comment)   10  weeks   PT Treatment/Interventions  Biofeedback;Therapeutic activities;Therapeutic exercise;Patient/family education;Neuromuscular re-education;Manual techniques;Energy conservation    PT Next Visit Plan  pelvic floor EMG with long holds    PT Home Exercise Plan  Access Code: [email or web address]     Recommended Other Services  MD renewal sent    Consulted and Agree with Plan of Care  Patient        Patient will benefit from skilled therapeutic intervention in order to improve the following deficits and impairments:  Increased fascial restricitons, Increased muscle spasms, Decreased activity tolerance, Decreased range of motion, Decreased strength, Impaired flexibility  Visit Diagnosis: Unspecified lack of coordination - Plan: PT plan of care cert/re-cert  Muscle weakness (generalized) - Plan: PT plan of care cert/re-cert     Problem List Patient Active Problem List   Diagnosis Date Noted  . 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 Dyspepsia and other specified disorders of function of stomach 11/26/2012  . Special screening for malignant neoplasms, colon 11/26/2012  . Healthcare maintenance 09/20/2011  . 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 NECK PAIN 11/15/2010  . ROTATOR CUFF SYNDROME 11/15/2010    Earlie Counts, PT 10/24/18 5:12 PM   [facility] [address] [place], [place], [address] Phone: [phone]   Fax:  [phone]  Name: Sherri Wilson MRN: [id number] Date of Birth: August 12, 1961

## 2018-11-07 ENCOUNTER — Ambulatory Visit: Payer: 59 | Admitting: Physical Therapy

## 2018-11-28 ENCOUNTER — Ambulatory Visit: Payer: 59 | Attending: Obstetrics and Gynecology | Admitting: Physical Therapy

## 2018-11-28 ENCOUNTER — Encounter: Payer: Self-pay | Admitting: Physical Therapy

## 2018-11-28 DIAGNOSIS — M6281 Muscle weakness (generalized): Secondary | ICD-10-CM | POA: Insufficient documentation

## 2018-11-28 DIAGNOSIS — R279 Unspecified lack of coordination: Secondary | ICD-10-CM | POA: Insufficient documentation

## 2018-11-28 NOTE — Therapy (Signed)
[facility] [address] [place], [place], [address] Phone: [phone]   Fax:  [phone]  Physical Therapy Treatment  Patient Details  Name: Sherri Wilson MRN: [id number] Date of Birth: 03/02/1961 Referring Provider (PT): Dr. Molli Posey   Encounter Date: 11/28/2018  PT End of Session - 11/28/18 1155    Visit Number  8    Date for PT Re-Evaluation  12/23/18    Authorization Type  UMR    PT Start Time  1150    PT Stop Time  1230    PT Time Calculation (min)  40 min    Activity Tolerance  Patient tolerated treatment well    Behavior During Therapy  [facility] for tasks assessed/performed       Past Medical History:  Diagnosis Date  . GERD (gastroesophageal reflux disease)   . Kidney stones     Past Surgical History:  Procedure Laterality Date  . EXTERNAL EAR SURGERY Left    plastic surgery, bitten by a horse    There were no vitals filed for this visit.  Subjective Assessment - 11/28/18 1153    Subjective  I have been running more. I have a little to none leakage. I am running 15 minutes. I wear a pad when I ride just in case. I leak some with the new saddle.     Patient Stated Goals  reduce urinary leakage    Currently in Pain?  No/denies    Multiple Pain Sites  No                    Pelvic Floor Special Questions - 11/28/18 0001    Biofeedback  sitting resting level 2.5 uv, Contract above 8 uv; plank, abdominal strength exercise with pelvic floor contraction; jumping jacks and running wiht pelvic floor contraction; standing hold 10 seconds and 15 seconds.     Biofeedback sensor type  Surface   vaginal   Biofeedback Activity  Other   squat, jumping jacks, lifting               PT Education - 11/28/18 1241    Education Details  pelvic floor contraction being held at 15 seconds.     Person(s) Educated  Patient    Methods  Explanation    Comprehension  Verbalized  understanding;Returned demonstration       PT Short Term Goals - 08/12/18 1108      PT SHORT TERM GOAL #2   Title  ability to perform diaphgramatic breathing to improve mobility of pelvic floor    Time  4    Period  Weeks    Status  Achieved      PT SHORT TERM GOAL #3   Title  understand correct toileting technique to not strain the pelvic floor    Time  4    Period  Weeks    Status  Achieved        PT Long Term Goals - 11/28/18 1249      PT LONG TERM GOAL #1   Title  independent with HEP and understand how to progress herself    Baseline  still learning    Time  12    Period  Weeks    Status  On-going      PT LONG TERM GOAL #2   Title  urinary leakage is minimal to none with horseback riding, running, and exercise due to improve coordination of the pelvic floor  Time  12    Period  Weeks    Status  On-going      PT LONG TERM GOAL #3   Title  fecal leakage is none with traveling due to understanding how to reduce constipation    Time  12    Period  Weeks    Status  Achieved      PT LONG TERM GOAL #4   Title  pelvic floor strength >/= 4/5 without contracting the gluteals     Time  12    Period  Weeks    Status  On-going            Plan - 11/28/18 1155    Clinical Impression Statement  Patient is able to run for 15 minutes with minimal urinary leakage. Patient reports she has less leakage with horseback riding since she went to the smaller seat. Patient is able to contract the pelvic floor with abdominal exercises while contracting the lower abdominals. Patient is able to hold pelvic floor contraction for 15 seconds in standing with struggle at the end. Patient will benefit from skilled therapy to improve pelvic floor coordination, lengthn muscles and reduce leakage.     Rehab Potential  Excellent    Clinical Impairments Affecting Rehab Potential  none    PT Frequency  1x / week    PT Duration  Other (comment)   10 weeks   PT Treatment/Interventions   Biofeedback;Therapeutic activities;Therapeutic exercise;Patient/family education;Neuromuscular re-education;Manual techniques;Energy conservation    PT Next Visit Plan  pelvic floor EMG with long holds to 20 seconds; renewal or discharge    PT Home Exercise Plan  Access Code: [email or web address]     Consulted and Agree with Plan of Care  Patient       Patient will benefit from skilled therapeutic intervention in order to improve the following deficits and impairments:  Increased fascial restricitons, Increased muscle spasms, Decreased activity tolerance, Decreased range of motion, Decreased strength, Impaired flexibility  Visit Diagnosis: Unspecified lack of coordination  Muscle weakness (generalized)     Problem List Patient Active Problem List   Diagnosis Date Noted  . 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 Dyspepsia and other specified disorders of function of stomach 11/26/2012  . Special screening for malignant neoplasms, colon 11/26/2012  . Healthcare maintenance 09/20/2011  . 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 NECK PAIN 11/15/2010  . ROTATOR CUFF SYNDROME 11/15/2010    Earlie Counts, PT 11/28/18 12:50 PM   [facility] [address], [place], [place], [address] Phone: [phone]   Fax:  [phone]  Name: Sherri Wilson MRN: [id number] Date of Birth: Apr 13, 1961

## 2018-12-12 ENCOUNTER — Ambulatory Visit: Payer: 59 | Admitting: Physical Therapy

## 2018-12-12 DIAGNOSIS — R279 Unspecified lack of coordination: Secondary | ICD-10-CM | POA: Diagnosis not present

## 2018-12-12 DIAGNOSIS — M6281 Muscle weakness (generalized): Secondary | ICD-10-CM

## 2018-12-12 NOTE — Patient Instructions (Addendum)
PNF D1: Resisted (Supine)    Secure ends, then loop band around bent knees. Lying flat, pull knees up and away. Hold for _2__ seconds. Relax for _2__ seconds. Repeat _10__ times. Do __1_ times a day. Keep lower back into floor  Copyright  VHI. All rights reserved.   Practice feeling the pelvic floor in a relaxed state. Understand when pelvic floor is tense and relaxed.    Slow Contraction: Gravity Resisted (Sitting)    Sitting, slowly squeeze pelvic floor for _30__ seconds. Rest for _10__ seconds. Repeat __5_ times. Do _2__ times a day. Also do in standing, walking, getting in and out of chair.   Copyright  VHI. All rights reserved.   [facility] [address], [place], [place] [address] Phone # [phone] Fax [phone]

## 2018-12-12 NOTE — Therapy (Addendum)
[facility] [address] [place], [place], [address] Phone: [phone]   Fax:  [phone]  Physical Therapy Treatment  Patient Details  Name: Sherri Wilson MRN: [id number] Date of Birth: April 13, 1961 Referring Provider (PT): Dr. Molli Posey   Encounter Date: 12/12/2018  PT End of Session - 12/12/18 1149    Visit Number  9    Date for PT Re-Evaluation  03/24/19    Authorization Type  [facility]    PT Start Time  1149    PT Stop Time  1227    PT Time Calculation (min)  38 min    Activity Tolerance  Patient tolerated treatment well    Behavior During Therapy  [facility] for tasks assessed/performed       Past Medical History:  Diagnosis Date  . GERD (gastroesophageal reflux disease)   . Kidney stones     Past Surgical History:  Procedure Laterality Date  . EXTERNAL EAR SURGERY Left    plastic surgery, bitten by a horse    There were no vitals filed for this visit.  Subjective Assessment - 12/12/18 1155    Subjective  I have not ridden alot or running due to the weather. I wear a pad when I ride and none with running. No leakage with running.     Patient Stated Goals  reduce urinary leakage    Currently in Pain?  No/denies         [facility] PT Assessment - 12/12/18 0001      Assessment   Medical Diagnosis  N39.3 Stress urinary incontinence    Referring Provider (PT)  Dr. Molli Posey    Onset Date/Surgical Date  07/23/17    Prior Therapy  none      Precautions   Precautions  None      Restrictions   Weight Bearing Restrictions  No      Home Environment   Living Environment  Private residence      Prior Function   Level of Independence  Independent    Vocation  Retired    Leisure  ride horses, run, hike, gym 3x/week      Cognition   Overall Cognitive Status  Within Functional Limits for tasks assessed      AROM   Lumbar Extension  full      Strength   Left Hip ABduction  4+/5      Palpation   SI assessment   ilium is balanced                Pelvic Floor Special Questions - 12/12/18 0001    Activities that cause leaking  Other    Other activities that cause leaking  riding horse    Strength  weak squeeze, no lift    Biofeedback  standing contract 20 uv for 30 second; standing 2 quick contractions with 5 second holds but has difficulty with relaxing; squat to relax pelvic floor to 5 uv; supine happy baby with pelvic floor relaxation, supine with hips up and out with yellow band for resistance to relax the pelvic floor    Biofeedback sensor type  Surface   vaginal   Biofeedback Activity  Other   hold 30 seconds; 2 quick flick 2 5 sec contraction               PT Education - 12/12/18 1227    Education Details  pelvic floor contraction in standing and sitting, relaxation of the pelvic floor  Person(s) Educated  Patient    Methods  Explanation;Demonstration;Verbal cues;Handout    Comprehension  Returned demonstration;Verbalized understanding       PT Short Term Goals - 08/12/18 1108      PT SHORT TERM GOAL #2   Title  ability to perform diaphgramatic breathing to improve mobility of pelvic floor    Time  4    Period  Weeks    Status  Achieved      PT SHORT TERM GOAL #3   Title  understand correct toileting technique to not strain the pelvic floor    Time  4    Period  Weeks    Status  Achieved        PT Long Term Goals - 12/12/18 1234      PT LONG TERM GOAL #1   Title  independent with HEP and understand how to progress herself    Baseline  still learning    Time  12    Period  Weeks    Status  On-going      PT LONG TERM GOAL #2   Title  urinary leakage is minimal to none with horseback riding, running, and exercise due to improve coordination of the pelvic floor    Time  12    Period  Weeks    Status  On-going      PT LONG TERM GOAL #3   Title  fecal leakage is none with traveling due to understanding how to reduce  constipation    Time  12    Period  Weeks    Status  Achieved      PT LONG TERM GOAL #4   Title  pelvic floor strength >/= 4/5 without contracting the gluteals     Time  12    Period  Weeks    Status  On-going            Plan - 12/12/18 1151    Clinical Impression Statement  Patient is only leaking urine with horseback riding. She is not leaking urine with running now. Patient resting tone of the pelvic floor increases with contraction from 5 uv to 8 uv. Patient has difficulty with understanding when she has tense pelvic floor or relaxed pelvic floor. Patient is able to do quick contractions with 5 second holds for control. Patient will benefit from skilled therapy to improve pelvic floor coordination, lengthen muscles and reduce leakage.     Rehab Potential  Excellent    Clinical Impairments Affecting Rehab Potential  none    PT Frequency  Other (comment)   2 times per month   PT Duration  12 weeks   10 weeks   PT Treatment/Interventions  Biofeedback;Therapeutic activities;Therapeutic exercise;Patient/family education;Neuromuscular re-education;Manual techniques;Energy conservation    PT Next Visit Plan  pelvic floor EMG with long holds to 20 seconds; relaxation of pelvic floor after contraction    PT Home Exercise Plan  Access Code: [email or web address]     Recommended Other Services  MD signed renewal and initial eval; sent md renewal on 12/12/2018    Consulted and Agree with Plan of Care  Patient       Patient will benefit from skilled therapeutic intervention in order to improve the following deficits and impairments:  Increased fascial restricitons, Increased muscle spasms, Decreased activity tolerance, Decreased range of motion, Decreased strength, Impaired flexibility  Visit Diagnosis: Unspecified lack of coordination - Plan: PT plan of care cert/re-cert  Muscle weakness (generalized) - Plan: PT plan of  care cert/re-cert     Problem List Patient Active Problem List    Diagnosis Date Noted  . 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 Dyspepsia and other specified disorders of function of stomach 11/26/2012  . Special screening for malignant neoplasms, colon 11/26/2012  . Healthcare maintenance 09/20/2011  . 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 NECK PAIN 11/15/2010  . ROTATOR CUFF SYNDROME 11/15/2010    Earlie Counts, PT 12/12/18 12:40 PM   [facility] [address], [place], [place], [address] Phone: [phone]   Fax:  [phone]  Name: Sherri Wilson MRN: [id number] Date of Birth: 1961-03-11  PHYSICAL THERAPY DISCHARGE SUMMARY  Visits from Start of Care: 9  Current functional level related to goals / functional outcomes: See above.    Remaining deficits: See above. Patient did not return since her last visit.    Education / Equipment: HEP  Plan:                                                    Patient goals were not met. Patient is being discharged due to not returning since the last visit.  Thank you for the referral. Earlie Counts, PT 02/06/19 10:35 AM  ?????

## 2019-08-27 DIAGNOSIS — Z01419 Encounter for gynecological examination (general) (routine) without abnormal findings: Secondary | ICD-10-CM | POA: Diagnosis not present

## 2019-08-27 DIAGNOSIS — N951 Menopausal and female climacteric states: Secondary | ICD-10-CM | POA: Diagnosis not present

## 2019-08-27 DIAGNOSIS — Z1231 Encounter for screening mammogram for malignant neoplasm of breast: Secondary | ICD-10-CM | POA: Diagnosis not present

## 2019-08-27 DIAGNOSIS — Z681 Body mass index (BMI) 19 or less, adult: Secondary | ICD-10-CM | POA: Diagnosis not present

## 2019-09-26 DIAGNOSIS — Z23 Encounter for immunization: Secondary | ICD-10-CM | POA: Diagnosis not present

## 2020-04-15 DIAGNOSIS — Z23 Encounter for immunization: Secondary | ICD-10-CM | POA: Diagnosis not present

## 2020-08-02 DIAGNOSIS — D1801 Hemangioma of skin and subcutaneous tissue: Secondary | ICD-10-CM | POA: Diagnosis not present

## 2020-08-02 DIAGNOSIS — L821 Other seborrheic keratosis: Secondary | ICD-10-CM | POA: Diagnosis not present

## 2020-08-02 DIAGNOSIS — L918 Other hypertrophic disorders of the skin: Secondary | ICD-10-CM | POA: Diagnosis not present

## 2020-08-02 DIAGNOSIS — L720 Epidermal cyst: Secondary | ICD-10-CM | POA: Diagnosis not present

## 2020-10-05 DIAGNOSIS — Z1231 Encounter for screening mammogram for malignant neoplasm of breast: Secondary | ICD-10-CM | POA: Diagnosis not present

## 2020-10-05 DIAGNOSIS — Z13820 Encounter for screening for osteoporosis: Secondary | ICD-10-CM | POA: Diagnosis not present

## 2020-10-18 ENCOUNTER — Other Ambulatory Visit (HOSPITAL_COMMUNITY): Payer: Self-pay | Admitting: Obstetrics and Gynecology

## 2020-10-27 ENCOUNTER — Other Ambulatory Visit (HOSPITAL_COMMUNITY): Payer: Self-pay | Admitting: Obstetrics and Gynecology

## 2020-10-27 DIAGNOSIS — Z79890 Hormone replacement therapy (postmenopausal): Secondary | ICD-10-CM | POA: Diagnosis not present

## 2020-10-27 DIAGNOSIS — Z01419 Encounter for gynecological examination (general) (routine) without abnormal findings: Secondary | ICD-10-CM | POA: Diagnosis not present

## 2020-10-27 DIAGNOSIS — Z681 Body mass index (BMI) 19 or less, adult: Secondary | ICD-10-CM | POA: Diagnosis not present

## 2020-11-08 DIAGNOSIS — R509 Fever, unspecified: Secondary | ICD-10-CM | POA: Diagnosis not present

## 2020-11-08 DIAGNOSIS — U071 COVID-19: Secondary | ICD-10-CM | POA: Diagnosis not present

## 2021-01-07 DIAGNOSIS — H524 Presbyopia: Secondary | ICD-10-CM | POA: Diagnosis not present

## 2021-01-07 DIAGNOSIS — H5203 Hypermetropia, bilateral: Secondary | ICD-10-CM | POA: Diagnosis not present

## 2021-02-02 ENCOUNTER — Telehealth: Payer: Self-pay

## 2021-02-02 NOTE — Telephone Encounter (Signed)
Called pt to schedule an appointment for 2/15 at 2 pm at the request of Dr. Harrell Gave. Left message to call back.

## 2021-02-03 NOTE — Telephone Encounter (Signed)
Appointment scheduled.

## 2021-02-08 ENCOUNTER — Other Ambulatory Visit: Payer: Self-pay

## 2021-02-08 ENCOUNTER — Ambulatory Visit: Payer: 59 | Admitting: Cardiology

## 2021-02-08 ENCOUNTER — Encounter: Payer: Self-pay | Admitting: Cardiology

## 2021-02-08 VITALS — BP 114/70 | HR 58 | Ht 62.0 in | Wt 103.4 lb

## 2021-02-08 DIAGNOSIS — Z8616 Personal history of COVID-19: Secondary | ICD-10-CM | POA: Diagnosis not present

## 2021-02-08 DIAGNOSIS — R079 Chest pain, unspecified: Secondary | ICD-10-CM

## 2021-02-08 DIAGNOSIS — R0602 Shortness of breath: Secondary | ICD-10-CM

## 2021-02-08 DIAGNOSIS — R072 Precordial pain: Secondary | ICD-10-CM | POA: Diagnosis not present

## 2021-02-08 DIAGNOSIS — Z7189 Other specified counseling: Secondary | ICD-10-CM

## 2021-02-08 DIAGNOSIS — Z01812 Encounter for preprocedural laboratory examination: Secondary | ICD-10-CM | POA: Diagnosis not present

## 2021-02-08 NOTE — Progress Notes (Signed)
Cardiology Office Note:    Date:  02/08/2021   ID:  Sherri Wilson, DOB Jan 20, 1961, MRN [id number]  PCP:  Molli Posey, MD  Cardiologist:  Buford Dresser, MD  Referring MD: Molli Posey, MD   CC: new patient consultation for chest pain and shortness of breath  History of Present Illness:    Sherri Wilson is a 60 y.o. female without significant prior cardiac history who is seen as a new consult at the request of Molli Posey, MD for the evaluation and management of chest pain and shortness of breath.  Today:  Main ongoing concern is feeling like she can't take a deep breath. Has been going on several months, but noted several weeks ago that she felt like she couldn't breathe and eat. Feels like a pressure in her chest. No pain with deep breathing, more of a feeling that she can't get enough air. Now feels near constant breathlessness. Can still walk dogs, ride horses. Can do what she needs to do without having to stop. The only time she doesn't note the breathlessness is while riding horses.  Had an event last fall of excruciating left sided chest pain. Didn't call anyone or have it evaluated. Lasted about 10 minutes. No associated symptoms, but had severe pain throughout the left side of her chest. This occurred after she had a call from her daughter in [place], was stressful time. Went to visit her daughter and everyone in the house came down with Covid-delta. Had a bad case of it, did not require hospitalization. Has not felt back to her baseline since that time.  Denies additional episodes of chest pain. No PND, orthopnea, LE edema or unexpected weight gain. No syncope or palpitations.  Cardiovascular risk factors: Prior clinical ASCVD: none Comorbid conditions, including hypertension, hyperlipidemia, diabetes, chronic kidney disease:  Metabolic syndrome/Obesity: none Chronic inflammatory conditions: none Tobacco use history: never Family history: father  had atrial fibrillation, had clean coronaries. Teena Irani had high blood pressure, died of a stroke in her 81s. Mother had hypertension. 2 siblings, both generally healthy Prior cardiac testing and/or incidental findings on other testing (ie coronary calcium): none Exercise level: very active, walks dogs, rides horses Current diet: whole foods, low sugar.  Past Medical History:  Diagnosis Date  . GERD (gastroesophageal reflux disease)   . Kidney stones     Past Surgical History:  Procedure Laterality Date  . EXTERNAL EAR SURGERY Left    plastic surgery, bitten by a horse    Current Medications: Current Outpatient Medications on File Prior to Visit  Medication Sig  . Calcium Citrate-Vitamin D (CITRACAL PETITES/VITAMIN D PO) Take 2 tablets by mouth daily.  Marland Kitchen estradiol (VIVELLE-DOT) 0.05 MG/24HR Place 1 patch onto the skin once a week.  . progesterone (PROMETRIUM) 100 MG capsule Take 100 mg by mouth daily.  . fish oil-omega-3 fatty acids 1000 MG capsule Take 2 g by mouth daily. (Patient not taking: Reported on 02/08/2021)   No current facility-administered medications on file prior to visit.     Allergies:   Patient has no known allergies.   Social History   Tobacco Use  . Smoking status: Never Smoker  . Smokeless tobacco: Never Used  Substance Use Topics  . Alcohol use: Yes    Comment: social alcohol  . Drug use: No    Family History: family history includes Colon polyps in her father; Hypertension in her father; Thyroid cancer in her daughter. There is no history of Colon cancer, Esophageal cancer,  Stomach cancer, or Rectal cancer.  ROS:   Please see the history of present illness.  Additional pertinent ROS: Constitutional: Negative for chills, fever, night sweats, unintentional weight loss  HENT: Negative for ear pain and hearing loss.   Eyes: Negative for loss of vision and eye pain.  Respiratory: Negative for cough, sputum, wheezing.   Cardiovascular: See  HPI. Gastrointestinal: Negative for abdominal pain, melena, and hematochezia.  Genitourinary: Negative for dysuria and hematuria.  Musculoskeletal: Negative for falls and myalgias.  Skin: Negative for itching and rash.  Neurological: Negative for focal weakness, focal sensory changes and loss of consciousness.  Endo/Heme/Allergies: Does not bruise/bleed easily.     EKGs/Labs/Other Studies Reviewed:    The following studies were reviewed today: No prior cardiac studies  EKG:  EKG is personally reviewed.  The ekg ordered today demonstrates sinus bradycardia at 58 bpm.  Recent Labs: No results found for requested labs within last 8760 hours.  Recent Lipid Panel No results found for: CHOL, TRIG, HDL, CHOLHDL, VLDL, LDLCALC, LDLDIRECT  Physical Exam:    VS:  BP 114/70   Pulse (!) 58   Ht 5\' 2"  (1.575 m)   Wt 103 lb 6.4 oz (46.9 kg)   SpO2 98%   BMI 18.91 kg/m     Wt Readings from Last 3 Encounters:  02/08/21 103 lb 6.4 oz (46.9 kg)  06/20/18 102 lb (46.3 kg)  05/22/18 99 lb (44.9 kg)    GEN: Well nourished, well developed in no acute distress HEENT: Normal, moist mucous membranes NECK: No JVD CARDIAC: regular rhythm, normal S1 and S2, no rubs or gallops. No murmur. VASCULAR: Radial and DP pulses 2+ bilaterally. No carotid bruits RESPIRATORY:  Clear to auscultation without rales, wheezing or rhonchi  ABDOMEN: Soft, non-tender, non-distended MUSCULOSKELETAL:  Ambulates independently SKIN: Warm and dry, no edema NEUROLOGIC:  Alert and oriented x 3. No focal neuro deficits noted. PSYCHIATRIC:  Normal affect    ASSESSMENT:    1. Shortness of breath   2. History of 2019 novel coronavirus disease (COVID-19)   3. Chest pain of uncertain etiology   4. Precordial pain   5. Pre-procedure lab exam   6. Cardiac risk counseling   7. Counseling on health promotion and disease prevention    PLAN:    Breathlessness/shortness of breath -unclear etiology. Not exertional, not  limiting, but nearly constant -has been present since Covid infection. She did not have a severe case but has lingering symptoms -will rule out systolic dysfunction, valve disease, effusion etc with echocardiogram -will get CBC, TSH, BMET to rule out other potential causes -if echo unrevealing, would consider PFTs/referral to pulmonology -if echo very abnormal, would cancel CT cardiac (below) and perform R/LHC.  Severe chest pain episode -has not recurred, but this was at rest, pre-Covid infection. Did occur after discussing a stressful situation with her daughter -no known risk factors -given severity of symptoms and now progressive breathlessness, discussed ruling out ischemia --discussed treadmill stress, nuclear stress/lexiscan, and CT coronary angiography. Discussed pros and cons of each, including but not limited to false positive/false negative risk, radiation risk, and risk of IV contrast dye. Based on shared decision making, decision was made to pursue CT coronary angiography. -has baseline bradycardia -counseled on need to get BMET prior to test -counseled on use of sublingual nitroglycerin and its importance to a good test. May be limited to using only one nitro given her baseline blood pressure.  -counseled on red flag warning signs that need immediate  medical attention  Cardiac risk counseling and prevention recommendations: -recommend heart healthy/Mediterranean diet, with whole grains, fruits, vegetable, fish, lean meats, nuts, and olive oil. Limit salt. -recommend moderate walking, 3-5 times/week for 30-50 minutes each session. Aim for at least 150 minutes.week. Goal should be pace of 3 miles/hours, or walking 1.5 miles in 30 minutes -recommend avoidance of tobacco products. Avoid excess alcohol. -ASCVD risk score: The ASCVD Risk score Mikey Bussing DC Jr., et al., 2013) failed to calculate for the following reasons:   Cannot find a previous HDL lab   Cannot find a previous total  cholesterol lab    Plan for follow up: TBD based on test results  Buford Dresser, MD, PhD, [facility]    Medication Adjustments/Labs and Tests Ordered: Current medicines are reviewed at length with the patient today.  Concerns regarding medicines are outlined above.  Orders Placed This Encounter  Procedures  . CT CORONARY MORPH W/CTA COR W/SCORE W/CA W/CM &/OR WO/CM  . CT CORONARY FRACTIONAL FLOW RESERVE DATA PREP  . CT CORONARY FRACTIONAL FLOW RESERVE FLUID ANALYSIS  . Basic metabolic panel  . Lipid panel  . CBC  . TSH  . EKG 12-Lead  . ECHOCARDIOGRAM COMPLETE   No orders of the defined types were placed in this encounter.   Patient Instructions  Medication Instructions:  Your Physician recommend you continue on your current medication as directed.    *If you need a refill on your cardiac medications before your next appointment, please call your pharmacy*   Lab Work: Your physician recommends that you return for lab work (Fasting lipid, BMP, CBC, TSH).  If you have labs (blood work) drawn today and your tests are completely normal, you will receive your results only by: Marland Kitchen MyChart Message (if you have MyChart) OR . A paper copy in the mail If you have any lab test that is abnormal or we need to change your treatment, we will call you to review the results.   Testing/Procedures: Your physician has requested that you have an echocardiogram. Echocardiography is a painless test that uses sound waves to create images of your heart. It provides your doctor with information about the size and shape of your heart and how well your heart's chambers and valves are working. This procedure takes approximately one hour. There are no restrictions for this procedure. [address]  Cardiac CT Angiography (CTA), is a special type of CT scan that uses a computer to produce multi-dimensional views of major blood vessels throughout the body.  In CT angiography, a contrast material is injected through an IV to help visualize the blood vessels [facility]    Follow-Up: At [facility], you and your health needs are our priority.  As part of our continuing mission to provide you with exceptional heart care, we have created designated Provider Care Teams.  These Care Teams include your primary Cardiologist (physician) and Advanced Practice Providers (APPs -  Physician Assistants and Nurse Practitioners) who all work together to provide you with the care you need, when you need it.  We recommend signing up for the patient portal called "MyChart".  Sign up information is provided on this After Visit Summary.  MyChart is used to connect with patients for Virtual Visits (Telemedicine).  Patients are able to view lab/test results, encounter notes, upcoming appointments, etc.  Non-urgent messages can be sent to your provider as well.   To learn more about what you can  do with MyChart, go to [email or web address].    Your next appointment:   Based on test results  The format for your next appointment:   In Person  Provider:   Buford Dresser, MD  Your cardiac CT will be scheduled at one of the below locations:   [facility] [address] [place], [place] [address] [phone]  If scheduled at [facility], please arrive at the [facility] main entrance (entrance A) of [facility] 30 minutes prior to test start time. Proceed to the [facility] Radiology Department (first floor) to check-in and test prep.  If scheduled at [facility], please arrive 15 mins early for check-in and test prep.  Please follow these instructions carefully (unless otherwise directed):   On the Night Before the Test: . Be sure to Drink plenty of water. . Do not consume any caffeinated/decaffeinated beverages or chocolate 12 hours prior to your test. . Do not take any antihistamines  12 hours prior to your test.   On the Day of the Test: . Drink plenty of water until 1 hour prior to the test. . Do not eat any food 4 hours prior to the test. . You may take your regular medications prior to the test.  . FEMALES- please wear underwire-free bra if available        After the Test: . Drink plenty of water. . After receiving IV contrast, you may experience a mild flushed feeling. This is normal. . On occasion, you may experience a mild rash up to 24 hours after the test. This is not dangerous. If this occurs, you can take Benadryl 25 mg and increase your fluid intake. . If you experience trouble breathing, this can be serious. If it is severe call 911 IMMEDIATELY. If it is mild, please call our office. . If you take any of these medications: Glipizide/Metformin, Avandament, Glucavance, please do not take 48 hours after completing test unless otherwise instructed.   Once we have confirmed authorization from your insurance company, we will call you to set up a date and time for your test. Based on how quickly your insurance processes prior authorizations requests, please allow up to 4 weeks to be contacted for scheduling your Cardiac CT appointment. Be advised that routine Cardiac CT appointments could be scheduled as many as 8 weeks after your provider has ordered it.  For non-scheduling related questions, please contact the cardiac imaging nurse navigator should you have any questions/concerns: Marchia Bond, Cardiac Imaging Nurse Navigator Gordy Clement, Cardiac Imaging Nurse Navigator  [facility] V[facility] Services Direct Office Dial: [phone]   For scheduling needs, including cancellations and rescheduling, please call [place], [phone].     Signed, Buford Dresser, MD PhD 02/08/2021 5:29 PM    [facility]

## 2021-02-08 NOTE — Patient Instructions (Signed)
Medication Instructions:  Your Physician recommend you continue on your current medication as directed.    *If you need a refill on your cardiac medications before your next appointment, please call your pharmacy*   Lab Work: Your physician recommends that you return for lab work (Fasting lipid, BMP, CBC, TSH).  If you have labs (blood work) drawn today and your tests are completely normal, you will receive your results only by: Marland Kitchen MyChart Message (if you have MyChart) OR . A paper copy in the mail If you have any lab test that is abnormal or we need to change your treatment, we will call you to review the results.   Testing/Procedures: Your physician has requested that you have an echocardiogram. Echocardiography is a painless test that uses sound waves to create images of your heart. It provides your doctor with information about the size and shape of your heart and how well your heart's chambers and valves are working. This procedure takes approximately one hour. There are no restrictions for this procedure. [facility] [address]  Cardiac CT Angiography (CTA), is a special type of CT scan that uses a computer to produce multi-dimensional views of major blood vessels throughout the body. In CT angiography, a contrast material is injected through an IV to help visualize the blood vessels [facility]    Follow-Up: At [facility], you and your health needs are our priority.  As part of our continuing mission to provide you with exceptional heart care, we have created designated Provider Care Teams.  These Care Teams include your primary Cardiologist (physician) and Advanced Practice Providers (APPs -  Physician Assistants and Nurse Practitioners) who all work together to provide you with the care you need, when you need it.  We recommend signing up for the patient portal called "MyChart".  Sign up information is provided on this After Visit Summary.  MyChart is used to  connect with patients for Virtual Visits (Telemedicine).  Patients are able to view lab/test results, encounter notes, upcoming appointments, etc.  Non-urgent messages can be sent to your provider as well.   To learn more about what you can do with MyChart, go to [email or web address].    Your next appointment:   Based on test results  The format for your next appointment:   In Person  Provider:   Buford Dresser, MD  Your cardiac CT will be scheduled at one of the below locations:   [facility] [address] [place], [place] [address] [phone]  If scheduled at [facility], please arrive at the [facility] main entrance (entrance A) of [facility] 30 minutes prior to test start time. Proceed to the [facility] Radiology Department (first floor) to check-in and test prep.  If scheduled at [facility], please arrive 15 mins early for check-in and test prep.  Please follow these instructions carefully (unless otherwise directed):   On the Night Before the Test: . Be sure to Drink plenty of water. . Do not consume any caffeinated/decaffeinated beverages or chocolate 12 hours prior to your test. . Do not take any antihistamines 12 hours prior to your test.   On the Day of the Test: . Drink plenty of water until 1 hour prior to the test. . Do not eat any food 4 hours prior to the test. . You may take your regular medications prior to the test.  . FEMALES- please wear underwire-free bra if available  After the Test: . Drink plenty of water. . After receiving IV contrast, you may experience a mild flushed feeling. This is normal. . On occasion, you may experience a mild rash up to 24 hours after the test. This is not dangerous. If this occurs, you can take Benadryl 25 mg and increase your fluid intake. . If you experience trouble breathing, this can be serious. If it is severe call 911 IMMEDIATELY. If it is  mild, please call our office. . If you take any of these medications: Glipizide/Metformin, Avandament, Glucavance, please do not take 48 hours after completing test unless otherwise instructed.   Once we have confirmed authorization from your insurance company, we will call you to set up a date and time for your test. Based on how quickly your insurance processes prior authorizations requests, please allow up to 4 weeks to be contacted for scheduling your Cardiac CT appointment. Be advised that routine Cardiac CT appointments could be scheduled as many as 8 weeks after your provider has ordered it.  For non-scheduling related questions, please contact the cardiac imaging nurse navigator should you have any questions/concerns: Marchia Bond, Cardiac Imaging Nurse Navigator Gordy Clement, Cardiac Imaging Nurse Navigator [facility] Direct Office Dial: [phone]   For scheduling needs, including cancellations and rescheduling, please call [place], [phone].

## 2021-02-16 DIAGNOSIS — Z1331 Encounter for screening for depression: Secondary | ICD-10-CM | POA: Diagnosis not present

## 2021-02-16 DIAGNOSIS — M8580 Other specified disorders of bone density and structure, unspecified site: Secondary | ICD-10-CM | POA: Diagnosis not present

## 2021-02-16 DIAGNOSIS — R0602 Shortness of breath: Secondary | ICD-10-CM | POA: Diagnosis not present

## 2021-03-03 ENCOUNTER — Other Ambulatory Visit: Payer: Self-pay

## 2021-03-03 ENCOUNTER — Ambulatory Visit (HOSPITAL_COMMUNITY): Payer: 59 | Attending: Cardiology

## 2021-03-03 DIAGNOSIS — R0602 Shortness of breath: Secondary | ICD-10-CM | POA: Diagnosis not present

## 2021-03-03 DIAGNOSIS — R079 Chest pain, unspecified: Secondary | ICD-10-CM | POA: Insufficient documentation

## 2021-03-03 LAB — ECHOCARDIOGRAM COMPLETE
Area-P 1/2: 3.99 cm2
P 1/2 time: 484 msec
S' Lateral: 2.4 cm

## 2021-03-14 ENCOUNTER — Ambulatory Visit (HOSPITAL_COMMUNITY): Payer: 59

## 2021-03-15 ENCOUNTER — Other Ambulatory Visit (HOSPITAL_BASED_OUTPATIENT_CLINIC_OR_DEPARTMENT_OTHER): Payer: Self-pay

## 2021-03-31 ENCOUNTER — Other Ambulatory Visit (HOSPITAL_COMMUNITY): Payer: Self-pay

## 2021-03-31 DIAGNOSIS — R079 Chest pain, unspecified: Secondary | ICD-10-CM | POA: Diagnosis not present

## 2021-03-31 DIAGNOSIS — R0602 Shortness of breath: Secondary | ICD-10-CM | POA: Diagnosis not present

## 2021-03-31 DIAGNOSIS — Z01812 Encounter for preprocedural laboratory examination: Secondary | ICD-10-CM | POA: Diagnosis not present

## 2021-03-31 LAB — TSH: TSH: 2.06 u[IU]/mL (ref 0.450–4.500)

## 2021-03-31 LAB — CBC
Hematocrit: 43.3 % (ref 34.0–46.6)
Hemoglobin: 14 g/dL (ref 11.1–15.9)
MCH: 29.5 pg (ref 26.6–33.0)
MCHC: 32.3 g/dL (ref 31.5–35.7)
MCV: 91 fL (ref 79–97)
Platelets: 147 10*3/uL — ABNORMAL LOW (ref 150–450)
RBC: 4.75 x10E6/uL (ref 3.77–5.28)
RDW: 12 % (ref 11.7–15.4)
WBC: 3.4 10*3/uL (ref 3.4–10.8)

## 2021-03-31 LAB — BASIC METABOLIC PANEL
BUN/Creatinine Ratio: 22 (ref 12–28)
BUN: 17 mg/dL (ref 8–27)
CO2: 25 mmol/L (ref 20–29)
Calcium: 9.2 mg/dL (ref 8.7–10.3)
Chloride: 104 mmol/L (ref 96–106)
Creatinine, Ser: 0.76 mg/dL (ref 0.57–1.00)
Glucose: 82 mg/dL (ref 65–99)
Potassium: 4.2 mmol/L (ref 3.5–5.2)
Sodium: 141 mmol/L (ref 134–144)
eGFR: 89 mL/min/{1.73_m2} (ref >59)

## 2021-03-31 LAB — LIPID PANEL
Chol/HDL Ratio: 2.5 ratio (ref 0.0–4.4)
Cholesterol, Total: 217 mg/dL — ABNORMAL HIGH (ref 100–199)
HDL: 87 mg/dL
LDL Chol Calc (NIH): 120 mg/dL — ABNORMAL HIGH (ref 0–99)
Triglycerides: 56 mg/dL (ref 0–149)
VLDL Cholesterol Cal: 10 mg/dL (ref 5–40)

## 2021-03-31 MED FILL — Progesterone Cap 100 MG: ORAL | 90 days supply | Qty: 90 | Fill #0 | Status: CN

## 2021-03-31 MED FILL — Estradiol TD Patch Twice Weekly 0.0375 MG/24HR: TRANSDERMAL | 84 days supply | Qty: 24 | Fill #0 | Status: AC

## 2021-04-01 ENCOUNTER — Telehealth (HOSPITAL_COMMUNITY): Payer: Self-pay | Admitting: *Deleted

## 2021-04-01 ENCOUNTER — Other Ambulatory Visit (HOSPITAL_COMMUNITY): Payer: Self-pay

## 2021-04-01 NOTE — Telephone Encounter (Signed)
Reaching out to patient to offer assistance regarding upcoming cardiac imaging study; pt verbalizes understanding of appt date/time, parking situation and where to check in, pre-test NPO status, and verified current allergies; name and call back number provided for further questions should they arise  Alajiah Dutkiewicz RN Navigator Cardiac Imaging [facility] [phone] office [phone] cell  

## 2021-04-04 ENCOUNTER — Encounter (HOSPITAL_BASED_OUTPATIENT_CLINIC_OR_DEPARTMENT_OTHER): Payer: Self-pay

## 2021-04-04 ENCOUNTER — Ambulatory Visit (HOSPITAL_BASED_OUTPATIENT_CLINIC_OR_DEPARTMENT_OTHER)
Admission: RE | Admit: 2021-04-04 | Discharge: 2021-04-04 | Disposition: A | Discharge status: Home / Self Care | Payer: 59 | Source: Ambulatory Visit | Attending: Cardiology | Admitting: Cardiology

## 2021-04-04 ENCOUNTER — Other Ambulatory Visit (HOSPITAL_COMMUNITY): Payer: Self-pay

## 2021-04-04 ENCOUNTER — Other Ambulatory Visit: Payer: Self-pay

## 2021-04-04 DIAGNOSIS — R072 Precordial pain: Secondary | ICD-10-CM | POA: Insufficient documentation

## 2021-04-04 MED ORDER — IOHEXOL 350 MG/ML SOLN
95.0000 mL | Freq: Once | INTRAVENOUS | Status: AC | PRN
  Administered 2021-04-04: 95 mL via INTRAVENOUS

## 2021-04-04 MED ORDER — METOPROLOL TARTRATE 5 MG/5ML IV SOLN
2.5000 mg | Freq: Once | INTRAVENOUS | Status: AC
  Administered 2021-04-04: 2.5 mg via INTRAVENOUS

## 2021-04-04 MED ORDER — TRETINOIN 0.025 % EX CREA
TOPICAL_CREAM | CUTANEOUS | 2 refills | Status: DC
  Filled 2021-04-04: qty 20, 30d supply, fill #0

## 2021-04-04 MED ORDER — NITROGLYCERIN 0.4 MG SL SUBL
0.4000 mg | SUBLINGUAL_TABLET | Freq: Once | SUBLINGUAL | Status: AC
  Administered 2021-04-04: 0.4 mg via SUBLINGUAL

## 2021-04-05 ENCOUNTER — Ambulatory Visit (HOSPITAL_COMMUNITY): Admission: RE | Admit: 2021-04-05 | Payer: 59 | Source: Ambulatory Visit

## 2021-04-06 ENCOUNTER — Other Ambulatory Visit (HOSPITAL_COMMUNITY): Payer: Self-pay

## 2021-04-07 ENCOUNTER — Other Ambulatory Visit (HOSPITAL_COMMUNITY): Payer: Self-pay

## 2021-05-12 ENCOUNTER — Other Ambulatory Visit (HOSPITAL_COMMUNITY): Payer: Self-pay

## 2021-05-12 MED FILL — Progesterone Cap 100 MG: ORAL | 90 days supply | Qty: 90 | Fill #0 | Status: AC

## 2021-05-13 ENCOUNTER — Other Ambulatory Visit (HOSPITAL_COMMUNITY): Payer: Self-pay

## 2021-06-28 ENCOUNTER — Other Ambulatory Visit (HOSPITAL_COMMUNITY): Payer: Self-pay

## 2021-06-28 MED FILL — Estradiol TD Patch Twice Weekly 0.0375 MG/24HR: TRANSDERMAL | 84 days supply | Qty: 24 | Fill #1 | Status: AC

## 2021-08-01 ENCOUNTER — Telehealth: Payer: Self-pay | Admitting: Cardiology

## 2021-08-01 DIAGNOSIS — Z8616 Personal history of COVID-19: Secondary | ICD-10-CM

## 2021-08-01 DIAGNOSIS — R0602 Shortness of breath: Secondary | ICD-10-CM

## 2021-08-01 NOTE — Telephone Encounter (Signed)
Returned call to patient, made patient aware that Referral to Pulmonology (Dr. Valeta Harms) has been placed and that someone from their office should reach out to get patient scheduled. Patient verbalized understanding and was very thankful for return call back and referral. Advised patient to call back to office with any issues, questions, or concerns. Patient verbalized understanding.   Will send patient a MyChart message with referral information and number to [facility].

## 2021-08-01 NOTE — Telephone Encounter (Signed)
Pt saw Dr. Harrell Gave 01/2021 and was offered to be referred to a Pulmonologist due to her SOB, pt would like to know if she can still get a referral to see a pulmonologist or should she be seen by Dr. Harrell Gave again. Please advise pt further

## 2021-08-01 NOTE — Telephone Encounter (Signed)
Returned call to patient who states that she was calling in regarding her shortness of breath. Patient states she recently had a negative cardiac work up at the beginning of this year to rule out any cardiac issues causing shortness of breath. Patient states that her shortness of breath is slightly worse than previous. Patient reports she is not able to do any exercise that she usually does due to this and it is now waking her at night. Patient denies any swelling, chest pain, or any other symptoms. Patient states that Dr. Harrell Gave had previously mentioned a pulmonology referral and was wondering if she could get this? Advised patient that I would forward message to Dr. Harrell Gave for her to review and advise. Patient verbalized understanding.

## 2021-08-01 NOTE — Telephone Encounter (Signed)
Happy to refer. Lars Mage, can you send in a referral to [facility]? I'd like her to see Dr. Valeta Harms if possible. Thanks!

## 2021-08-10 ENCOUNTER — Other Ambulatory Visit (HOSPITAL_COMMUNITY): Payer: Self-pay

## 2021-08-10 MED FILL — Progesterone Cap 100 MG: ORAL | 90 days supply | Qty: 90 | Fill #1 | Status: AC

## 2021-09-20 ENCOUNTER — Other Ambulatory Visit (HOSPITAL_COMMUNITY): Payer: Self-pay

## 2021-09-20 MED ORDER — ESTRADIOL 0.0375 MG/24HR TD PTTW
MEDICATED_PATCH | TRANSDERMAL | 0 refills | Status: DC
  Filled 2021-09-20: qty 24, 84d supply, fill #0

## 2021-09-21 ENCOUNTER — Other Ambulatory Visit (HOSPITAL_COMMUNITY): Payer: Self-pay

## 2021-10-06 ENCOUNTER — Encounter: Payer: Self-pay | Admitting: Pulmonary Disease

## 2021-10-06 ENCOUNTER — Ambulatory Visit: Payer: 59 | Admitting: Pulmonary Disease

## 2021-10-06 ENCOUNTER — Other Ambulatory Visit: Payer: Self-pay

## 2021-10-06 VITALS — BP 104/78 | HR 54 | Temp 97.9°F | Ht 62.0 in | Wt 101.8 lb

## 2021-10-06 DIAGNOSIS — Z8616 Personal history of COVID-19: Secondary | ICD-10-CM

## 2021-10-06 DIAGNOSIS — R0602 Shortness of breath: Secondary | ICD-10-CM | POA: Diagnosis not present

## 2021-10-06 DIAGNOSIS — R911 Solitary pulmonary nodule: Secondary | ICD-10-CM

## 2021-10-06 NOTE — Patient Instructions (Signed)
Thank you for visiting Dr. Valeta Harms at [facility]. Today we recommend the following:  Orders Placed This Encounter  Procedures   CT CHEST HIGH RESOLUTION   Return in about 6 months (around 04/06/2022) for w/ Dr. Valeta Harms . After CT Chest complete.     Please do your part to reduce the spread of COVID-19.

## 2021-10-06 NOTE — Progress Notes (Signed)
Synopsis: Referred in Oct 2022 for history of covid19 by Buford Dresser,*  Subjective:   PATIENT ID: Sherri Wilson GENDER: female DOB: Jan 15, 1961, MRN: [id number]  Chief Complaint  Patient presents with   Consult    Pt. Says she's had a lot of respiratory problems after having covid but she is doing a lot better now. Pt. Says she still has shortness of breath.     PMH of GERD and history of kidney stones. In November was diagnosed with COVID-19 after visiting family in [place]. Was not hospitalized. Was considered for MAB. Did not take any of the antivirals. Saw Dr. Karyl Kinnier from cardiology. She had a cardiac scoring ct, calcium scoring was normal.  Reviewing the CT imaging today with the patient we discussed some of the parenchymal abnormalities found to include some faint GGO within the lower lobe.  We also discussed a subpleural right sided 4 mm pulmonary nodule.  She is not had any previous malignant diagnoses.  She is a non-smoker and no significant secondhand smoke exposure.   Past Medical History:  Diagnosis Date   GERD (gastroesophageal reflux disease)    Kidney stones      Family History  Problem Relation Age of Onset   Hypertension Father    Colon polyps Father    Thyroid cancer Daughter    Colon cancer Neg Hx    Esophageal cancer Neg Hx    Stomach cancer Neg Hx    Rectal cancer Neg Hx      Past Surgical History:  Procedure Laterality Date   EXTERNAL EAR SURGERY Left    plastic surgery, bitten by a horse    Social History   Socioeconomic History   Marital status: Married    Spouse name: Not on file   Number of children: 3   Years of education: Not on file   Highest education level: Not on file  Occupational History   Occupation: homemaker  Tobacco Use   Smoking status: Never   Smokeless tobacco: Never  Substance and Sexual Activity   Alcohol use: Yes    Comment: social alcohol   Drug use: No   Sexual activity: Not on file  Other Topics  Concern   Not on file  Social History Narrative   Not on file   Social Determinants of Health   Financial Resource Strain: Not on file  Food Insecurity: Not on file  Transportation Needs: Not on file  Physical Activity: Not on file  Stress: Not on file  Social Connections: Not on file  Intimate Partner Violence: Not on file     No Known Allergies   Outpatient Medications Prior to Visit  Medication Sig Dispense Refill   Calcium Citrate-Vitamin D (CITRACAL PETITES/VITAMIN D PO) Take 2 tablets by mouth daily.     estradiol (VIVELLE-DOT) 0.0375 MG/24HR APPLY 1 PATCH TWICE WEEKLY AS DIRECTED. 24 patch 3   estradiol (VIVELLE-DOT) 0.0375 MG/24HR Apply one patch twice weekly 24 patch 0   estradiol (VIVELLE-DOT) 0.05 MG/24HR Place 1 patch onto the skin once a week.     progesterone (PROMETRIUM) 100 MG capsule Take 100 mg by mouth daily.     progesterone (PROMETRIUM) 100 MG capsule TAKE 1 CAPSULE BY MOUTH AT BEDTIME 90 capsule 3   tretinoin (RETIN-A) 0.025 % cream Apply topically to the skin at bedtime as directed. 20 g 2   fish oil-omega-3 fatty acids 1000 MG capsule Take 2 g by mouth daily.     No facility-administered medications prior  to visit.    Review of Systems  Constitutional:  Negative for chills, fever, malaise/fatigue and weight loss.  HENT:  Negative for hearing loss, sore throat and tinnitus.   Eyes:  Negative for blurred vision and double vision.  Respiratory:  Positive for shortness of breath. Negative for cough, hemoptysis, sputum production, wheezing and stridor.   Cardiovascular:  Negative for chest pain, palpitations, orthopnea, leg swelling and PND.  Gastrointestinal:  Negative for abdominal pain, constipation, diarrhea, heartburn, nausea and vomiting.  Genitourinary:  Negative for dysuria, hematuria and urgency.  Musculoskeletal:  Negative for joint pain and myalgias.  Skin:  Negative for itching and rash.  Neurological:  Negative for dizziness, tingling,  weakness and headaches.  Endo/Heme/Allergies:  Negative for environmental allergies. Does not bruise/bleed easily.  Psychiatric/Behavioral:  Negative for depression. The patient is not nervous/anxious and does not have insomnia.   All other systems reviewed and are negative.   Objective:  Physical Exam Vitals reviewed.  Constitutional:      General: She is not in acute distress.    Appearance: She is well-developed.     Comments: Thin  HENT:     Head: Normocephalic and atraumatic.  Eyes:     General: No scleral icterus.    Conjunctiva/sclera: Conjunctivae normal.     Pupils: Pupils are equal, round, and reactive to light.  Neck:     Vascular: No JVD.     Trachea: No tracheal deviation.  Cardiovascular:     Rate and Rhythm: Normal rate and regular rhythm.     Heart sounds: Normal heart sounds. No murmur heard. Pulmonary:     Effort: Pulmonary effort is normal. No tachypnea, accessory muscle usage or respiratory distress.     Breath sounds: Normal breath sounds. No stridor. No wheezing, rhonchi or rales.  Abdominal:     General: Bowel sounds are normal. There is no distension.     Palpations: Abdomen is soft.     Tenderness: There is no abdominal tenderness.  Musculoskeletal:        General: No tenderness.     Cervical back: Neck supple.  Lymphadenopathy:     Cervical: No cervical adenopathy.  Skin:    General: Skin is warm and dry.     Capillary Refill: Capillary refill takes less than 2 seconds.     Findings: No rash.  Neurological:     Mental Status: She is alert and oriented to person, place, and time.  Psychiatric:        Behavior: Behavior normal.     Vitals:   10/06/21 0945  BP: 104/78  Pulse: (!) 54  Temp: 97.9 F (36.6 C)  TempSrc: Oral  SpO2: 100%  Weight: 101 lb 12.8 oz (46.2 kg)  Height: 5\' 2"  (1.575 m)   100% on RA BMI Readings from Last 3 Encounters:  10/06/21 18.62 kg/m  02/08/21 18.91 kg/m  06/20/18 18.66 kg/m   Wt Readings from Last 3  Encounters:  10/06/21 101 lb 12.8 oz (46.2 kg)  02/08/21 103 lb 6.4 oz (46.9 kg)  06/20/18 102 lb (46.3 kg)     CBC    Component Value Date/Time   WBC 3.4 03/31/2021 0803   RBC 4.75 03/31/2021 0803   HGB 14.0 03/31/2021 0803   HCT 43.3 03/31/2021 0803   PLT 147 (L) 03/31/2021 0803   MCV 91 03/31/2021 0803   MCH 29.5 03/31/2021 0803   MCHC 32.3 03/31/2021 0803   RDW 12.0 03/31/2021 0803    Chest Imaging:  04/04/2021 CT coronary: Incidentally found right sided subpleural pulmonary nodule. Also has some fine parenchymal changes located within the base and subpleural sparing. The patient's images have been independently reviewed by me.    Pulmonary Functions Testing Results: No flowsheet data found.  FeNO:   Pathology:   Echocardiogram:   Heart Catheterization:     Assessment & Plan:     ICD-10-CM   1. Lung nodule  R91.1 CT CHEST HIGH RESOLUTION    2. SOB (shortness of breath)  R06.02 CT CHEST HIGH RESOLUTION    3. History of COVID-19  Z86.16       Discussion:  This is a 60 year old female, no real significant past medical history.  Found to have a incidental pulmonary nodule on a coronary calcium scoring CT within the right lung.  Subpleural in nature.  Also has a history of COVID-19.  I did explain that subpleural nodules are sometimes in fact intraparenchymal lymph nodes that can be enlarged.  After having a viral URI/pneumonia it is common to see some of these reactive in size.  She is up-to-date on her cancer screening and to has not had any malignancies in the past.  She is also a non-smoker we discussed the likelihood of subcentimeter lung nodules being malignant which are very low chances.  Also she has some areas of lower lobe groundglass and subpleural sparing with some faint parenchymal changes, no history of rheumatologic disease, I suspect changes are related to her previous diagnosis of COVID-19 which can be seen radiographically for several months after  having pneumonia.  Plan: I think it would be reasonable to have a repeat noncontrasted CT image in 1 year after her April scan. Therefore we will have an HRCT completed in April 2023 for follow-up of the lung nodule as well as evaluation of the parenchyma. Patient is agreeable to this plan. We appreciate the consultation. She is on call us if her symptoms change    Current Outpatient Medications:    Calcium Citrate-Vitamin D (CITRACAL PETITES/VITAMIN D PO), Take 2 tablets by mouth daily., Disp: , Rfl:    estradiol (VIVELLE-DOT) 0.0375 MG/24HR, APPLY 1 PATCH TWICE WEEKLY AS DIRECTED., Disp: 24 patch, Rfl: 3   estradiol (VIVELLE-DOT) 0.0375 MG/24HR, Apply one patch twice weekly, Disp: 24 patch, Rfl: 0   estradiol (VIVELLE-DOT) 0.05 MG/24HR, Place 1 patch onto the skin once a week., Disp: , Rfl:    progesterone (PROMETRIUM) 100 MG capsule, Take 100 mg by mouth daily., Disp: , Rfl:    progesterone (PROMETRIUM) 100 MG capsule, TAKE 1 CAPSULE BY MOUTH AT BEDTIME, Disp: 90 capsule, Rfl: 3   tretinoin (RETIN-A) 0.025 % cream, Apply topically to the skin at bedtime as directed., Disp: 20 g, Rfl: 2   Garner Nash, DO [facility] 10/06/2021 9:51 AM

## 2021-10-31 ENCOUNTER — Other Ambulatory Visit (HOSPITAL_COMMUNITY): Payer: Self-pay

## 2021-10-31 DIAGNOSIS — Z681 Body mass index (BMI) 19 or less, adult: Secondary | ICD-10-CM | POA: Diagnosis not present

## 2021-10-31 DIAGNOSIS — Z1231 Encounter for screening mammogram for malignant neoplasm of breast: Secondary | ICD-10-CM | POA: Diagnosis not present

## 2021-10-31 DIAGNOSIS — Z01419 Encounter for gynecological examination (general) (routine) without abnormal findings: Secondary | ICD-10-CM | POA: Diagnosis not present

## 2021-10-31 MED ORDER — PROGESTERONE MICRONIZED 100 MG PO CAPS
100.0000 mg | ORAL_CAPSULE | Freq: Every day | ORAL | 3 refills | Status: AC
  Filled 2021-10-31 – 2021-11-09 (×2): qty 90, 90d supply, fill #0
  Filled 2022-02-03: qty 90, 90d supply, fill #1
  Filled 2022-05-02: qty 90, 90d supply, fill #0
  Filled 2022-08-07: qty 30, 30d supply, fill #1
  Filled 2022-08-07: qty 60, 60d supply, fill #1

## 2021-10-31 MED ORDER — ESTRADIOL 0.0375 MG/24HR TD PTTW
MEDICATED_PATCH | TRANSDERMAL | 3 refills | Status: AC
  Filled 2021-10-31 – 2021-11-29 (×2): qty 24, 84d supply, fill #0
  Filled 2022-03-03: qty 24, 84d supply, fill #1
  Filled 2022-05-24: qty 24, 84d supply, fill #0
  Filled 2022-08-07: qty 24, 84d supply, fill #1

## 2021-11-08 ENCOUNTER — Other Ambulatory Visit (HOSPITAL_COMMUNITY): Payer: Self-pay

## 2021-11-09 ENCOUNTER — Other Ambulatory Visit (HOSPITAL_COMMUNITY): Payer: Self-pay

## 2021-11-16 DIAGNOSIS — D2262 Melanocytic nevi of left upper limb, including shoulder: Secondary | ICD-10-CM | POA: Diagnosis not present

## 2021-11-16 DIAGNOSIS — D2261 Melanocytic nevi of right upper limb, including shoulder: Secondary | ICD-10-CM | POA: Diagnosis not present

## 2021-11-16 DIAGNOSIS — D1801 Hemangioma of skin and subcutaneous tissue: Secondary | ICD-10-CM | POA: Diagnosis not present

## 2021-11-16 DIAGNOSIS — D2272 Melanocytic nevi of left lower limb, including hip: Secondary | ICD-10-CM | POA: Diagnosis not present

## 2021-11-16 DIAGNOSIS — D2271 Melanocytic nevi of right lower limb, including hip: Secondary | ICD-10-CM | POA: Diagnosis not present

## 2021-11-16 DIAGNOSIS — L821 Other seborrheic keratosis: Secondary | ICD-10-CM | POA: Diagnosis not present

## 2021-11-29 ENCOUNTER — Other Ambulatory Visit (HOSPITAL_COMMUNITY): Payer: Self-pay

## 2021-11-30 ENCOUNTER — Other Ambulatory Visit (HOSPITAL_COMMUNITY): Payer: Self-pay

## 2022-02-03 ENCOUNTER — Other Ambulatory Visit (HOSPITAL_COMMUNITY): Payer: Self-pay

## 2022-03-03 ENCOUNTER — Other Ambulatory Visit (HOSPITAL_COMMUNITY): Payer: Self-pay

## 2022-03-06 ENCOUNTER — Other Ambulatory Visit (HOSPITAL_COMMUNITY): Payer: Self-pay

## 2022-03-07 ENCOUNTER — Other Ambulatory Visit (HOSPITAL_COMMUNITY): Payer: Self-pay

## 2022-04-04 ENCOUNTER — Ambulatory Visit (INDEPENDENT_AMBULATORY_CARE_PROVIDER_SITE_OTHER)
Admission: RE | Admit: 2022-04-04 | Discharge: 2022-04-04 | Disposition: A | Discharge status: Home / Self Care | Payer: 59 | Source: Ambulatory Visit | Attending: Pulmonary Disease | Admitting: Pulmonary Disease

## 2022-04-04 DIAGNOSIS — R0602 Shortness of breath: Secondary | ICD-10-CM

## 2022-04-04 DIAGNOSIS — R911 Solitary pulmonary nodule: Secondary | ICD-10-CM | POA: Diagnosis not present

## 2022-04-12 NOTE — Progress Notes (Signed)
Ty please let ms. Martinique know I have reviewed her ct chest and it appears normal. No additional ct image follow up needed. No evidence of interstitial changes.  ? ?Thanks, ? ?BLI ? ?Garner Nash, DO ?[facility] ?04/12/2022 4:39 PM  ?

## 2022-04-25 DIAGNOSIS — M859 Disorder of bone density and structure, unspecified: Secondary | ICD-10-CM | POA: Diagnosis not present

## 2022-04-25 DIAGNOSIS — R7989 Other specified abnormal findings of blood chemistry: Secondary | ICD-10-CM | POA: Diagnosis not present

## 2022-05-02 ENCOUNTER — Other Ambulatory Visit (HOSPITAL_BASED_OUTPATIENT_CLINIC_OR_DEPARTMENT_OTHER): Payer: Self-pay

## 2022-05-02 DIAGNOSIS — D72819 Decreased white blood cell count, unspecified: Secondary | ICD-10-CM | POA: Diagnosis not present

## 2022-05-02 DIAGNOSIS — Z1331 Encounter for screening for depression: Secondary | ICD-10-CM | POA: Diagnosis not present

## 2022-05-02 DIAGNOSIS — M8580 Other specified disorders of bone density and structure, unspecified site: Secondary | ICD-10-CM | POA: Diagnosis not present

## 2022-05-02 DIAGNOSIS — Z0000 Encounter for general adult medical examination without abnormal findings: Secondary | ICD-10-CM | POA: Diagnosis not present

## 2022-05-02 DIAGNOSIS — D696 Thrombocytopenia, unspecified: Secondary | ICD-10-CM | POA: Diagnosis not present

## 2022-05-02 DIAGNOSIS — Z1339 Encounter for screening examination for other mental health and behavioral disorders: Secondary | ICD-10-CM | POA: Diagnosis not present

## 2022-05-02 DIAGNOSIS — N393 Stress incontinence (female) (male): Secondary | ICD-10-CM | POA: Diagnosis not present

## 2022-05-24 ENCOUNTER — Other Ambulatory Visit (HOSPITAL_BASED_OUTPATIENT_CLINIC_OR_DEPARTMENT_OTHER): Payer: Self-pay

## 2022-08-07 ENCOUNTER — Other Ambulatory Visit (HOSPITAL_BASED_OUTPATIENT_CLINIC_OR_DEPARTMENT_OTHER): Payer: Self-pay

## 2022-08-17 DIAGNOSIS — F411 Generalized anxiety disorder: Secondary | ICD-10-CM | POA: Diagnosis not present

## 2022-08-31 DIAGNOSIS — F411 Generalized anxiety disorder: Secondary | ICD-10-CM | POA: Diagnosis not present

## 2022-09-06 DIAGNOSIS — D1801 Hemangioma of skin and subcutaneous tissue: Secondary | ICD-10-CM | POA: Diagnosis not present

## 2022-09-06 DIAGNOSIS — D692 Other nonthrombocytopenic purpura: Secondary | ICD-10-CM | POA: Diagnosis not present

## 2022-09-06 DIAGNOSIS — D2272 Melanocytic nevi of left lower limb, including hip: Secondary | ICD-10-CM | POA: Diagnosis not present

## 2022-09-06 DIAGNOSIS — L812 Freckles: Secondary | ICD-10-CM | POA: Diagnosis not present

## 2022-09-06 DIAGNOSIS — L821 Other seborrheic keratosis: Secondary | ICD-10-CM | POA: Diagnosis not present

## 2022-09-06 DIAGNOSIS — D2271 Melanocytic nevi of right lower limb, including hip: Secondary | ICD-10-CM | POA: Diagnosis not present

## 2022-09-06 DIAGNOSIS — D225 Melanocytic nevi of trunk: Secondary | ICD-10-CM | POA: Diagnosis not present

## 2022-09-19 DIAGNOSIS — F411 Generalized anxiety disorder: Secondary | ICD-10-CM | POA: Diagnosis not present

## 2022-09-27 DIAGNOSIS — F411 Generalized anxiety disorder: Secondary | ICD-10-CM | POA: Diagnosis not present

## 2022-10-05 DIAGNOSIS — F411 Generalized anxiety disorder: Secondary | ICD-10-CM | POA: Diagnosis not present

## 2022-10-18 DIAGNOSIS — F411 Generalized anxiety disorder: Secondary | ICD-10-CM | POA: Diagnosis not present

## 2022-11-06 ENCOUNTER — Other Ambulatory Visit (HOSPITAL_BASED_OUTPATIENT_CLINIC_OR_DEPARTMENT_OTHER): Payer: Self-pay

## 2022-11-06 DIAGNOSIS — Z13820 Encounter for screening for osteoporosis: Secondary | ICD-10-CM | POA: Diagnosis not present

## 2022-11-06 DIAGNOSIS — Z1151 Encounter for screening for human papillomavirus (HPV): Secondary | ICD-10-CM | POA: Diagnosis not present

## 2022-11-06 DIAGNOSIS — Z124 Encounter for screening for malignant neoplasm of cervix: Secondary | ICD-10-CM | POA: Diagnosis not present

## 2022-11-06 DIAGNOSIS — Z1231 Encounter for screening mammogram for malignant neoplasm of breast: Secondary | ICD-10-CM | POA: Diagnosis not present

## 2022-11-06 DIAGNOSIS — Z681 Body mass index (BMI) 19 or less, adult: Secondary | ICD-10-CM | POA: Diagnosis not present

## 2022-11-06 DIAGNOSIS — Z01419 Encounter for gynecological examination (general) (routine) without abnormal findings: Secondary | ICD-10-CM | POA: Diagnosis not present

## 2022-11-06 MED ORDER — PROGESTERONE MICRONIZED 100 MG PO CAPS
100.0000 mg | ORAL_CAPSULE | Freq: Every day | ORAL | 3 refills | Status: DC
  Filled 2022-11-06: qty 90, 90d supply, fill #0
  Filled 2023-02-05: qty 90, 90d supply, fill #1
  Filled 2023-04-23: qty 90, 90d supply, fill #2
  Filled 2023-07-23: qty 90, 90d supply, fill #3

## 2022-11-06 MED ORDER — ESTRADIOL 0.0375 MG/24HR TD PTTW
1.0000 | MEDICATED_PATCH | TRANSDERMAL | 3 refills | Status: DC
  Filled 2022-11-06: qty 24, 84d supply, fill #0
  Filled 2023-02-05: qty 24, 84d supply, fill #1
  Filled 2023-04-23: qty 24, 84d supply, fill #2
  Filled 2023-07-23: qty 24, 84d supply, fill #3

## 2022-11-13 DIAGNOSIS — F411 Generalized anxiety disorder: Secondary | ICD-10-CM | POA: Diagnosis not present

## 2022-11-29 DIAGNOSIS — F411 Generalized anxiety disorder: Secondary | ICD-10-CM | POA: Diagnosis not present

## 2022-12-13 DIAGNOSIS — F411 Generalized anxiety disorder: Secondary | ICD-10-CM | POA: Diagnosis not present

## 2023-01-03 DIAGNOSIS — F411 Generalized anxiety disorder: Secondary | ICD-10-CM | POA: Diagnosis not present

## 2023-01-10 DIAGNOSIS — H524 Presbyopia: Secondary | ICD-10-CM | POA: Diagnosis not present

## 2023-02-14 DIAGNOSIS — F411 Generalized anxiety disorder: Secondary | ICD-10-CM | POA: Diagnosis not present

## 2023-04-23 ENCOUNTER — Other Ambulatory Visit (HOSPITAL_BASED_OUTPATIENT_CLINIC_OR_DEPARTMENT_OTHER): Payer: Self-pay

## 2023-04-23 ENCOUNTER — Other Ambulatory Visit: Payer: Self-pay

## 2023-04-27 ENCOUNTER — Encounter: Payer: Self-pay | Admitting: Gastroenterology

## 2023-06-29 ENCOUNTER — Encounter: Payer: Self-pay | Admitting: Gastroenterology

## 2023-07-09 ENCOUNTER — Encounter (HOSPITAL_COMMUNITY): Payer: Self-pay

## 2023-07-09 ENCOUNTER — Ambulatory Visit (HOSPITAL_COMMUNITY): Payer: Commercial Managed Care - PPO

## 2023-07-09 ENCOUNTER — Ambulatory Visit (HOSPITAL_COMMUNITY)
Admission: RE | Admit: 2023-07-09 | Discharge: 2023-07-09 | Disposition: A | Discharge status: Home / Self Care | Payer: Commercial Managed Care - PPO | Source: Ambulatory Visit | Attending: Internal Medicine | Admitting: Internal Medicine

## 2023-07-09 VITALS — BP 107/58 | HR 58 | Temp 97.9°F | Resp 18

## 2023-07-09 DIAGNOSIS — S92514A Nondisplaced fracture of proximal phalanx of right lesser toe(s), initial encounter for closed fracture: Secondary | ICD-10-CM

## 2023-07-09 DIAGNOSIS — S92511A Displaced fracture of proximal phalanx of right lesser toe(s), initial encounter for closed fracture: Secondary | ICD-10-CM | POA: Diagnosis not present

## 2023-07-09 NOTE — ED Provider Notes (Signed)
[facility]    CSN: [id number] Arrival date & time: 07/09/23  1336      History   Chief Complaint Chief Complaint  Patient presents with   Foot Injury    impact injury one week ago  and is still painful. would like to X-ray foot for possible broken bones - Entered by patient    HPI Indea D [place] is a 62 y.o. female.   Patient presents today for right foot pain.  Reports approximately 10 days ago, she was riding her horse when the horse slipped and fell onto it's right side, trapping her right leg underneath its body.  She reports bruising, swelling, and pain to the first and second digits of her right foot since the injury.  Reports the bruising has gotten little bit better as well as the swelling.  She reports after a long day on her feet, she has pain in the bottom of her foot as well as her right great toe.  No obvious deformity, redness, fever, nausea/vomiting since the pain began.  She has been taking Aleve at nighttime which helps for the pain.    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      Home Medications    Prior to Admission medications   Medication Sig Start Date End Date Taking? Authorizing Provider  Calcium Citrate-Vitamin D (CITRACAL PETITES/VITAMIN D PO) Take 2 tablets by mouth daily.    [provider]  estradiol (VIVELLE-DOT) 0.0375 MG/24HR  APPLY 1 PATCH TWICE WEEKLY AS DIRECTED. 10/18/20 10/18/21  Richarda Overlie, MD  estradiol (VIVELLE-DOT) 0.0375 MG/24HR Apply one patch twice weekly 10/31/21     estradiol (VIVELLE-DOT) 0.0375 MG/24HR Place 1 patch onto the skin 2 (two) times a week as directed. 11/06/22     estradiol (VIVELLE-DOT) 0.05 MG/24HR Place 1 patch onto the skin once a week.    [provider]  progesterone (PROMETRIUM) 100 MG capsule Take 100 mg by mouth daily.    [provider]  progesterone (PROMETRIUM) 100 MG capsule TAKE 1 CAPSULE BY MOUTH ONCE A DAY AT BEDTIME 10/31/21     progesterone (PROMETRIUM) 100 MG capsule Take 1 capsule (100 mg total) by mouth at bedtime. 11/06/22     tretinoin (RETIN-A) 0.025 % cream Apply topically to the skin at bedtime as directed. 08/02/20   Donzetta Starch, MD    Family History Family History  Problem Relation Age of Onset   Hypertension Father    Colon polyps Father    Thyroid cancer Daughter    Colon cancer Neg Hx    Esophageal cancer Neg Hx    Stomach cancer Neg Hx    Rectal cancer Neg Hx     Social History Social History   Tobacco Use   Smoking status: Never   Smokeless tobacco: Never  Substance Use Topics   Alcohol use: Yes  Comment: social alcohol   Drug use: No     Allergies   Patient has no known allergies.   Review of Systems Review of Systems Per HPI  Physical Exam Triage Vital Signs ED Triage Vitals  Encounter Vitals Group     BP 07/09/23 1409 (!) 107/58     Systolic BP Percentile --      Diastolic BP Percentile --      Pulse Rate 07/09/23 1409 (!) 58     Resp 07/09/23 1409 18     Temp 07/09/23 1409 97.9 F (36.6 C)     Temp Source 07/09/23 1409 Oral     SpO2 07/09/23 1409 97 %     Weight --      Height --      Head Circumference --      Peak Flow --      Pain Score 07/09/23 1410 4     Pain Loc --      Pain Education --      Exclude from Growth Chart --    No data found.  Updated Vital Signs BP (!) 107/58 (BP  Location: Right Arm)   Pulse (!) 58   Temp 97.9 F (36.6 C) (Oral)   Resp 18   SpO2 97%   Visual Acuity Right Eye Distance:   Left Eye Distance:   Bilateral Distance:    Right Eye Near:   Left Eye Near:    Bilateral Near:     Physical Exam Vitals and nursing note reviewed.  Constitutional:      General: She is not in acute distress.    Appearance: Normal appearance. She is not toxic-appearing.  HENT:     Mouth/Throat:     Mouth: Mucous membranes are moist.     Pharynx: Oropharynx is clear.  Pulmonary:     Effort: Pulmonary effort is normal. No respiratory distress.  Musculoskeletal:     Comments: Inspection: Mild bruising to the distal tip of the first digit; otherwise no bruising, obvious deformity, redness, or swelling Palpation: tender to palpation to the first and second digits diffusely; no obvious deformities palpated ROM: Full ROM to right ankle and foot Strength: 5/5 bilateral lower extremities Neurovascular: neurovascularly intact in bilateral lower extremities  Skin:    General: Skin is warm and dry.     Capillary Refill: Capillary refill takes less than 2 seconds.     Coloration: Skin is not jaundiced or pale.     Findings: No erythema.  Neurological:     Mental Status: She is alert and oriented to person, place, and time.  Psychiatric:        Behavior: Behavior is cooperative.      UC Treatments / Results  Labs (all labs ordered are listed, but only abnormal results are displayed) Labs Reviewed - No data to display  EKG   Radiology DG Foot Complete Right  Result Date: 07/09/2023 CLINICAL DATA:  Injury while riding a horse. EXAM: RIGHT FOOT COMPLETE - 3+ VIEW COMPARISON:  None Available. FINDINGS: Oblique fracture of the mid shaft of the proximal phalanx of the third toe without angulation or displacement. No extension to either articular surface. The other bones of the foot are normal. IMPRESSION: Oblique fracture of the mid shaft of the proximal  phalanx of the third toe without angulation or displacement. Electronically Signed   By: Paulina Fusi M.D.   On: 07/09/2023 14:51    Procedures Procedures (including critical care time)  Medications Ordered in  UC Medications - No data to display  Initial Impression / Assessment and Plan / UC Course  I have reviewed the triage vital signs and the nursing notes.  Pertinent labs & imaging results that were available during my care of the patient were reviewed by me and considered in my medical decision making (see chart for details).   Patient is well-appearing, normotensive, afebrile, not tachycardic, not tachypneic, oxygenating well on room air.    1. Closed nondisplaced fracture of proximal phalanx of lesser toe of right foot, initial encounter Discussed x-ray results with patient Provided postop shoe and recommended wearing whenever weightbearing Supportive care discussed including continued Aleve, ice, elevation as needed Follow-up with podiatry for persistent symptoms despite treatment or no improvement in symptoms despite treatment  The patient was given the opportunity to ask questions.  All questions answered to their satisfaction.  The patient is in agreement to this plan. Final Clinical Impressions(s) / UC Diagnoses   Final diagnoses:  Closed nondisplaced fracture of proximal phalanx of lesser toe of right foot, initial encounter     Discharge Instructions      Your right third toe is fractured/broken.  Please wear the post op shoe whenever you are weightbearing for the next couple of weeks.  You can continue Aleve at night time as needed for pain.    Follow up with [facility] if symptoms persist or worsen despite treatment.    ED Prescriptions   None    PDMP not reviewed this encounter.   Valentino Nose, NP 07/09/23 [phone]

## 2023-07-09 NOTE — ED Triage Notes (Signed)
Pt states riding a horse a week ago Saturday and the horse slipped and fell landing on her rt side. C/o rt foot pain and rt rib pain.

## 2023-07-09 NOTE — Discharge Instructions (Addendum)
Your right third toe is fractured/broken.  Please wear the post op shoe whenever you are weightbearing for the next couple of weeks.  You can continue Aleve at night time as needed for pain.    Follow up with [facility] if symptoms persist or worsen despite treatment.

## 2023-08-08 ENCOUNTER — Encounter: Payer: Self-pay | Admitting: Gastroenterology

## 2023-08-08 ENCOUNTER — Other Ambulatory Visit (HOSPITAL_BASED_OUTPATIENT_CLINIC_OR_DEPARTMENT_OTHER): Payer: Self-pay

## 2023-08-08 ENCOUNTER — Ambulatory Visit (AMBULATORY_SURGERY_CENTER): Payer: Commercial Managed Care - PPO

## 2023-08-08 VITALS — Ht 62.0 in | Wt 105.0 lb

## 2023-08-08 DIAGNOSIS — Z1211 Encounter for screening for malignant neoplasm of colon: Secondary | ICD-10-CM

## 2023-08-08 DIAGNOSIS — Z800 Family history of malignant neoplasm of digestive organs: Secondary | ICD-10-CM

## 2023-08-08 MED ORDER — NA SULFATE-K SULFATE-MG SULF 17.5-3.13-1.6 GM/177ML PO SOLN
1.0000 | Freq: Once | ORAL | 0 refills | Status: AC
  Filled 2023-08-08: qty 354, 1d supply, fill #0

## 2023-08-08 NOTE — Progress Notes (Signed)
 No egg or soy allergy known to patient  No issues known to pt with past sedation with any surgeries or procedures Patient denies ever being told they had issues or difficulty with intubation  No FH of Malignant Hyperthermia Pt is not on diet pills Pt is not on  home 02  Pt is not on blood thinners  Pt denies issues with constipation  No A fib or A flutter Have any cardiac testing pending--no Pt can ambulate independently Pt denies use of chewing tobacco Discussed diabetic I weight loss medication holds Discussed NSAID holds Checked BMI Pt instructed to use Single[employer] or GoodRx for a price reduction on prep  Patient's chart reviewed by Sherri Wilson CNRA prior to previsit and patient appropriate for the [facility].  Pre visit completed and red dot placed by patient's name on their procedure day (on provider's schedule).

## 2023-08-13 ENCOUNTER — Other Ambulatory Visit (HOSPITAL_BASED_OUTPATIENT_CLINIC_OR_DEPARTMENT_OTHER): Payer: Self-pay

## 2023-08-28 ENCOUNTER — Ambulatory Visit (AMBULATORY_SURGERY_CENTER): Payer: Commercial Managed Care - PPO | Admitting: Gastroenterology

## 2023-08-28 ENCOUNTER — Encounter: Payer: Self-pay | Admitting: Gastroenterology

## 2023-08-28 VITALS — BP 97/57 | HR 55 | Temp 98.9°F | Resp 12 | Ht 62.0 in | Wt 105.0 lb

## 2023-08-28 DIAGNOSIS — Z1211 Encounter for screening for malignant neoplasm of colon: Secondary | ICD-10-CM | POA: Diagnosis not present

## 2023-08-28 MED ORDER — SODIUM CHLORIDE 0.9 % IV SOLN: 500.0000 mL | Freq: Once | INTRAVENOUS | Status: DC

## 2023-08-28 NOTE — Progress Notes (Signed)
GASTROENTEROLOGY PROCEDURE H&P NOTE   Primary Care Physician: Melida Quitter, MD  HPI: Sherri Wilson is a 62 y.o. female who presents for Colonoscopy for screening.  Past Medical History:  Diagnosis Date   GERD (gastroesophageal reflux disease)    Kidney stones    Past Surgical History:  Procedure Laterality Date   EXTERNAL EAR SURGERY Left    plastic surgery, bitten by a horse   Current Outpatient Medications  Medication Sig Dispense Refill   estradiol (VIVELLE-DOT) 0.0375 MG/24HR APPLY 1 PATCH TWICE WEEKLY AS DIRECTED. 24 patch 3   estradiol (VIVELLE-DOT) 0.0375 MG/24HR Apply one patch twice weekly 24 patch 3   progesterone (PROMETRIUM) 100 MG capsule TAKE 1 CAPSULE BY MOUTH ONCE A DAY AT BEDTIME 90 capsule 3   tretinoin (RETIN-A) 0.025 % cream Apply topically to the skin at bedtime as directed. 20 g 2   VITAMIN D, CHOLECALCIFEROL, PO Take by mouth.     Current Facility-Administered Medications  Medication Dose Route Frequency Provider Last Rate Last Admin   0.9 %  sodium chloride infusion  500 mL Intravenous Once Mansouraty, Netty Starring., MD        Current Outpatient Medications:    estradiol (VIVELLE-DOT) 0.0375 MG/24HR, APPLY 1 PATCH TWICE WEEKLY AS DIRECTED., Disp: 24 patch, Rfl: 3   estradiol (VIVELLE-DOT) 0.0375 MG/24HR, Apply one patch twice weekly, Disp: 24 patch, Rfl: 3   progesterone (PROMETRIUM) 100 MG capsule, TAKE 1 CAPSULE BY MOUTH ONCE A DAY AT BEDTIME, Disp: 90 capsule, Rfl: 3   tretinoin (RETIN-A) 0.025 % cream, Apply topically to the skin at bedtime as directed., Disp: 20 g, Rfl: 2   VITAMIN D, CHOLECALCIFEROL, PO, Take by mouth., Disp: , Rfl:   Current Facility-Administered Medications:    0.9 %  sodium chloride infusion, 500 mL, Intravenous, Once, Mansouraty, Netty Starring., MD No Known Allergies Family History  Problem Relation Age of Onset   Colon cancer Mother    Hypertension Father    Colon polyps Father    Diverticulitis Maternal Grandfather     Thyroid cancer Daughter    Esophageal cancer Neg Hx    Stomach cancer Neg Hx    Rectal cancer Neg Hx    Social History   Socioeconomic History   Marital status: Married    Spouse name: Not on file   Number of children: 3   Years of education: Not on file   Highest education level: Not on file  Occupational History   Occupation: homemaker  Tobacco Use   Smoking status: Never   Smokeless tobacco: Never  Substance and Sexual Activity   Alcohol use: Not Currently    Comment: social alcohol   Drug use: No   Sexual activity: Not on file  Other Topics Concern   Not on file  Social History Narrative   Not on file   Social Determinants of Health   Financial Resource Strain: Not on file  Food Insecurity: Not on file  Transportation Needs: Not on file  Physical Activity: Not on file  Stress: Not on file  Social Connections: Not on file  Intimate Partner Violence: Not on file    Physical Exam: Today's Vitals   08/28/23 0851  BP: (!) 90/57  Pulse: (!) 52  SpO2: 98%  Weight: 105 lb (47.6 kg)  Height: 5\' 2"  (1.575 m)   Body mass index is 19.2 kg/m. GEN: NAD EYE: Sclerae anicteric ENT: MMM CV: Non-tachycardic GI: Soft, NT/ND NEURO:  Alert & Oriented x 3  Lab Results: No results for input(s): "WBC", "HGB", "HCT", "PLT" in the last 72 hours. BMET No results for input(s): "NA", "K", "CL", "CO2", "GLUCOSE", "BUN", "CREATININE", "CALCIUM" in the last 72 hours. LFT No results for input(s): "PROT", "ALBUMIN", "AST", "ALT", "ALKPHOS", "BILITOT", "BILIDIR", "IBILI" in the last 72 hours. PT/INR No results for input(s): "LABPROT", "INR" in the last 72 hours.   Impression / Plan: This is a 62 y.o.female who presents for Colonoscopy for screening.  The risks and benefits of endoscopic evaluation/treatment were discussed with the patient and/or family; these include but are not limited to the risk of perforation, infection, bleeding, missed lesions, lack of diagnosis,  severe illness requiring hospitalization, as well as anesthesia and sedation related illnesses.  The patient's history has been reviewed, patient examined, no change in status, and deemed stable for procedure.  The patient and/or family is agreeable to proceed.    Corliss Parish, MD [facility] Office # [phone]

## 2023-08-28 NOTE — Progress Notes (Signed)
Pt's states no medical or surgical changes since previsit or office visit. 

## 2023-08-28 NOTE — Op Note (Signed)
[facility] Patient Name: Sherri Wilson Procedure Date: 08/28/2023 9:45 AM MRN: [id number] Endoscopist: Corliss Parish , MD, [id number] Age: 62 Referring MD:  Date of Birth: [birth year] Gender: Female Account #: [id number] Procedure:                Colonoscopy Indications:              Screening patient at increased risk: Family history                            of 1st-degree relative with colorectal cancer at                            age 65 years (or older) Medicines:                Monitored Anesthesia Care Procedure:                Pre-Anesthesia Assessment:                           - Prior to the procedure, a History and Physical                            was performed, and patient medications and                            allergies were reviewed. The patient's tolerance of                            previous anesthesia was also reviewed. The risks                            and benefits of the procedure and the sedation                            options and risks were discussed with the patient.                            All questions were answered, and informed consent                            was obtained. Prior Anticoagulants: The patient has                            taken no anticoagulant or antiplatelet agents. ASA                            Grade Assessment: II - A patient with mild systemic                            disease. After reviewing the risks and benefits,                            the patient was deemed in satisfactory condition to  undergo the procedure.                           After obtaining informed consent, the colonoscope                            was passed under direct vision. Throughout the                            procedure, the patient's blood pressure, pulse, and                            oxygen saturations were monitored continuously. The                            Olympus PCF-[id number]  (#[id number]) Colonoscope was                            introduced through the anus and advanced to the the                            cecum, identified by appendiceal orifice and                            ileocecal valve. The colonoscopy was performed                            without difficulty. The patient tolerated the                            procedure. The quality of the bowel preparation was                            good. The terminal ileum, ileocecal valve,                            appendiceal orifice, and rectum were photographed. Scope In: 9:48:51 AM Scope Out: 10:00:47 AM Scope Withdrawal Time: 0 hours 6 minutes 37 seconds  Total Procedure Duration: 0 hours 11 minutes 56 seconds  Findings:                 The digital rectal exam findings include                            hemorrhoids. Pertinent negatives include no                            palpable rectal lesions.                           The colon (entire examined portion) was moderately                            tortuous.  The terminal ileum and ileocecal valve appeared                            normal.                           Normal mucosa was found in the entire colon.                           Non-bleeding non-thrombosed internal hemorrhoids                            were found during retroflexion, during perianal                            exam and during digital exam. The hemorrhoids were                            Grade II (internal hemorrhoids that prolapse but                            reduce spontaneously). Complications:            No immediate complications. Estimated Blood Loss:     Estimated blood loss: none. Impression:               - Hemorrhoids found on digital rectal exam.                           - Tortuous colon.                           - The examined portion of the ileum was normal.                           - Normal mucosa in the entire examined colon.                            - Non-bleeding non-thrombosed internal hemorrhoids. Recommendation:           - The patient will be observed post-procedure,                            until all discharge criteria are met.                           - Discharge patient to home.                           - Patient has a contact number available for                            emergencies. The signs and symptoms of potential                            delayed complications were discussed with the  patient. Return to normal activities tomorrow.                            Written discharge instructions were provided to the                            patient.                           - Continue present medications.                           - Repeat colonoscopy in 5 years for screening                            purposes due to patient's family history.                           - The findings and recommendations were discussed                            with the patient.                           - The findings and recommendations were discussed                            with the patient's family. Corliss Parish, MD 08/28/2023 10:09:49 AM

## 2023-08-28 NOTE — Patient Instructions (Signed)

## 2023-08-28 NOTE — Progress Notes (Signed)
Uneventful anesthetic. Report to pacu rn. Vss. Care resumed by rn. 

## 2023-08-29 ENCOUNTER — Telehealth: Payer: Self-pay

## 2023-08-29 NOTE — Telephone Encounter (Signed)
  Follow up Call-     08/28/2023    9:01 AM  Call back number  Post procedure Call Back phone  # [phone]  Permission to leave phone message Yes     Patient questions:  Do you have a fever, pain , or abdominal swelling? No. Pain Score  0 *  Have you tolerated food without any problems? Yes.    Have you been able to return to your normal activities? Yes.    Do you have any questions about your discharge instructions: Diet   No. Medications  No. Follow up visit  No.  Do you have questions or concerns about your Care? No.  Actions: * If pain score is 4 or above: No action needed, pain <4.

## 2023-10-16 ENCOUNTER — Other Ambulatory Visit (HOSPITAL_BASED_OUTPATIENT_CLINIC_OR_DEPARTMENT_OTHER): Payer: Self-pay

## 2023-10-17 ENCOUNTER — Other Ambulatory Visit (HOSPITAL_BASED_OUTPATIENT_CLINIC_OR_DEPARTMENT_OTHER): Payer: Self-pay

## 2023-10-17 ENCOUNTER — Other Ambulatory Visit: Payer: Self-pay

## 2023-10-17 MED ORDER — ESTRADIOL 0.0375 MG/24HR TD PTTW
1.0000 | MEDICATED_PATCH | TRANSDERMAL | 0 refills | Status: DC
  Filled 2023-10-17: qty 24, 84d supply, fill #0

## 2023-10-17 MED ORDER — PROGESTERONE MICRONIZED 100 MG PO CAPS
100.0000 mg | ORAL_CAPSULE | Freq: Every day | ORAL | 0 refills | Status: DC
  Filled 2023-10-17: qty 90, 90d supply, fill #0

## 2023-10-31 ENCOUNTER — Other Ambulatory Visit (HOSPITAL_BASED_OUTPATIENT_CLINIC_OR_DEPARTMENT_OTHER): Payer: Self-pay

## 2023-11-02 ENCOUNTER — Other Ambulatory Visit (HOSPITAL_BASED_OUTPATIENT_CLINIC_OR_DEPARTMENT_OTHER): Payer: Self-pay

## 2023-11-02 MED ORDER — COMIRNATY 30 MCG/0.3ML IM SUSY
0.3000 mL | PREFILLED_SYRINGE | Freq: Once | INTRAMUSCULAR | 0 refills | Status: AC
  Filled 2023-11-02: qty 0.3, 1d supply, fill #0

## 2023-11-05 ENCOUNTER — Other Ambulatory Visit (HOSPITAL_BASED_OUTPATIENT_CLINIC_OR_DEPARTMENT_OTHER): Payer: Self-pay

## 2023-11-08 ENCOUNTER — Other Ambulatory Visit (HOSPITAL_BASED_OUTPATIENT_CLINIC_OR_DEPARTMENT_OTHER): Payer: Self-pay

## 2023-11-08 DIAGNOSIS — Z1231 Encounter for screening mammogram for malignant neoplasm of breast: Secondary | ICD-10-CM | POA: Diagnosis not present

## 2023-11-08 DIAGNOSIS — Z01419 Encounter for gynecological examination (general) (routine) without abnormal findings: Secondary | ICD-10-CM | POA: Diagnosis not present

## 2023-11-08 DIAGNOSIS — Z124 Encounter for screening for malignant neoplasm of cervix: Secondary | ICD-10-CM | POA: Diagnosis not present

## 2023-11-08 DIAGNOSIS — Z1151 Encounter for screening for human papillomavirus (HPV): Secondary | ICD-10-CM | POA: Diagnosis not present

## 2023-11-08 DIAGNOSIS — Z79890 Hormone replacement therapy (postmenopausal): Secondary | ICD-10-CM | POA: Diagnosis not present

## 2023-11-08 DIAGNOSIS — Z681 Body mass index (BMI) 19 or less, adult: Secondary | ICD-10-CM | POA: Diagnosis not present

## 2023-11-08 MED ORDER — PROGESTERONE MICRONIZED 100 MG PO CAPS
100.0000 mg | ORAL_CAPSULE | Freq: Every day | ORAL | 3 refills | Status: DC
  Filled 2023-11-08 – 2024-01-08 (×2): qty 90, 90d supply, fill #0
  Filled 2024-03-31: qty 90, 90d supply, fill #1
  Filled 2024-06-24: qty 90, 90d supply, fill #2
  Filled 2024-09-15 – 2024-09-16 (×2): qty 90, 90d supply, fill #3

## 2023-11-08 MED ORDER — ESTRADIOL 0.0375 MG/24HR TD PTTW
1.0000 | MEDICATED_PATCH | TRANSDERMAL | 3 refills | Status: AC
Start: 2023-11-08 — End: ?
  Filled 2023-11-08 – 2024-01-08 (×2): qty 24, 84d supply, fill #0
  Filled 2024-03-31: qty 24, 84d supply, fill #1
  Filled 2024-06-24: qty 24, 84d supply, fill #2
  Filled 2024-09-15: qty 24, 84d supply, fill #3

## 2023-12-12 ENCOUNTER — Other Ambulatory Visit (HOSPITAL_BASED_OUTPATIENT_CLINIC_OR_DEPARTMENT_OTHER): Payer: Self-pay

## 2024-01-08 ENCOUNTER — Other Ambulatory Visit (HOSPITAL_BASED_OUTPATIENT_CLINIC_OR_DEPARTMENT_OTHER): Payer: Self-pay

## 2024-02-06 DIAGNOSIS — Z125 Encounter for screening for malignant neoplasm of prostate: Secondary | ICD-10-CM | POA: Diagnosis not present

## 2024-02-06 DIAGNOSIS — R7989 Other specified abnormal findings of blood chemistry: Secondary | ICD-10-CM | POA: Diagnosis not present

## 2024-02-06 DIAGNOSIS — Z0000 Encounter for general adult medical examination without abnormal findings: Secondary | ICD-10-CM | POA: Diagnosis not present

## 2024-02-13 DIAGNOSIS — Z1331 Encounter for screening for depression: Secondary | ICD-10-CM | POA: Diagnosis not present

## 2024-02-13 DIAGNOSIS — Z1339 Encounter for screening examination for other mental health and behavioral disorders: Secondary | ICD-10-CM | POA: Diagnosis not present

## 2024-02-13 DIAGNOSIS — Z0000 Encounter for general adult medical examination without abnormal findings: Secondary | ICD-10-CM | POA: Diagnosis not present

## 2024-02-13 DIAGNOSIS — M8580 Other specified disorders of bone density and structure, unspecified site: Secondary | ICD-10-CM | POA: Diagnosis not present

## 2024-02-13 DIAGNOSIS — D72819 Decreased white blood cell count, unspecified: Secondary | ICD-10-CM | POA: Diagnosis not present

## 2024-02-13 DIAGNOSIS — Z23 Encounter for immunization: Secondary | ICD-10-CM | POA: Diagnosis not present

## 2024-02-13 DIAGNOSIS — R0602 Shortness of breath: Secondary | ICD-10-CM | POA: Diagnosis not present

## 2024-02-19 IMAGING — CT CT CHEST HIGH RESOLUTION
2 of 8 series · 14 of 36 positions shown, 17 images · non-contrast
Comparison: 04/04/2021

CLINICAL DATA: Persistent shortness of breath, COVID 5552,
nonsmoker



[Series 5: high resolution · axial · 0.57mm/px · z∈[-310,-42]mm · 11 of 323 slices shown, 14 images]
[im 27/323  mediastinal]
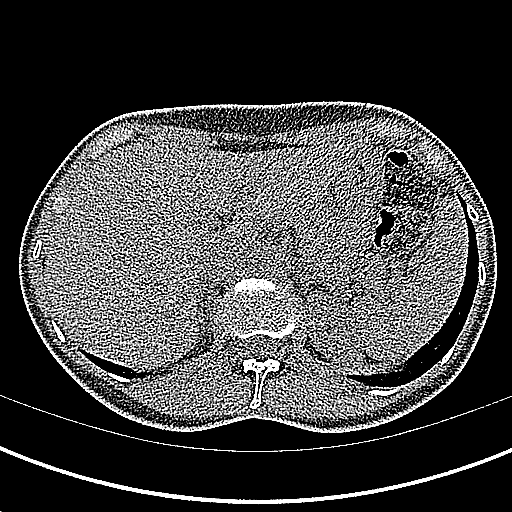
[im 27/323  lung]
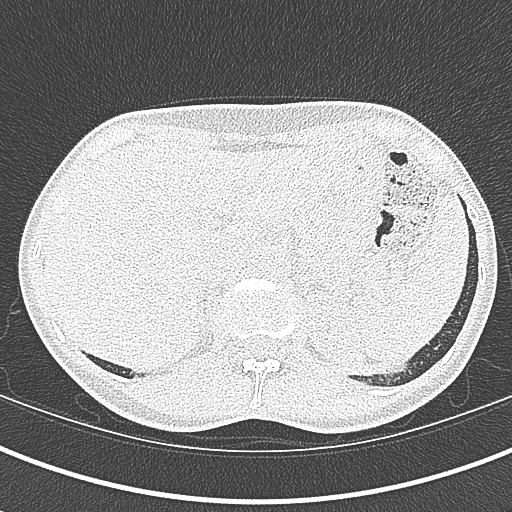
[im 54/323  lung]
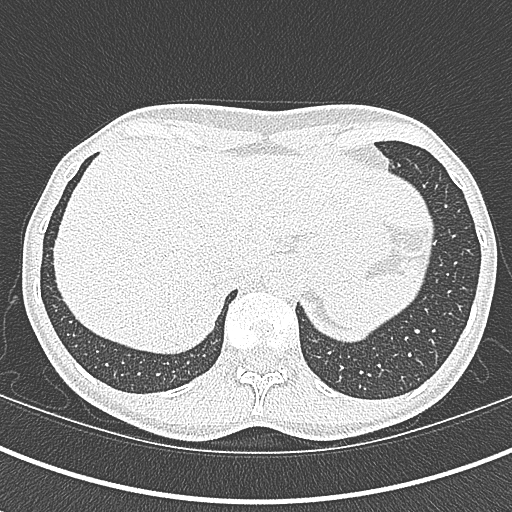
[im 81/323  lung]
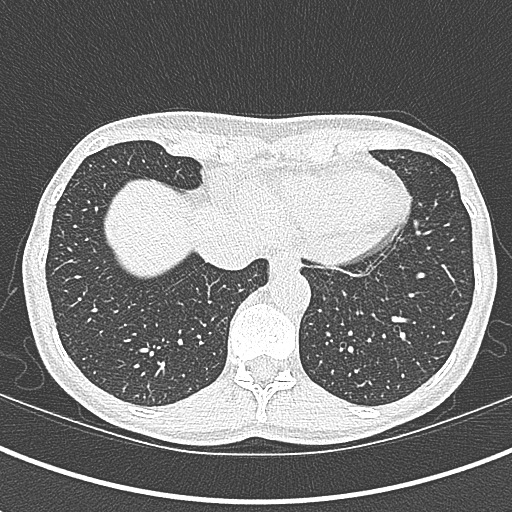
[im 108/323  lung]
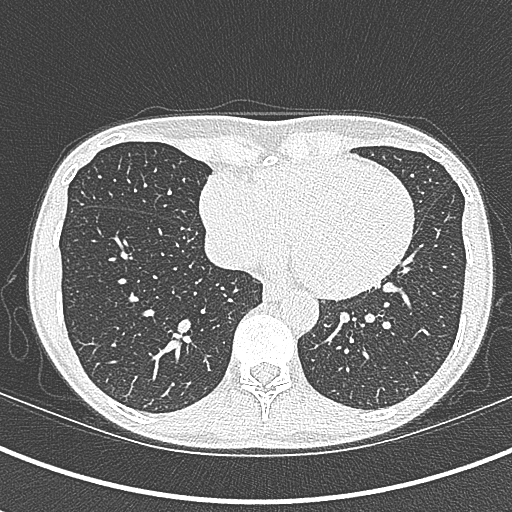
[im 135/323  mediastinal]
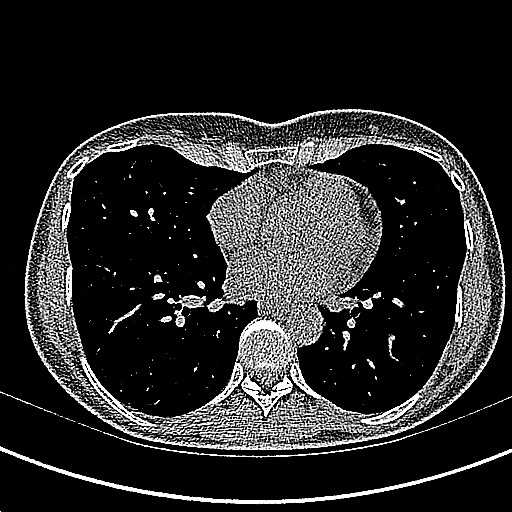
[im 135/323  lung]
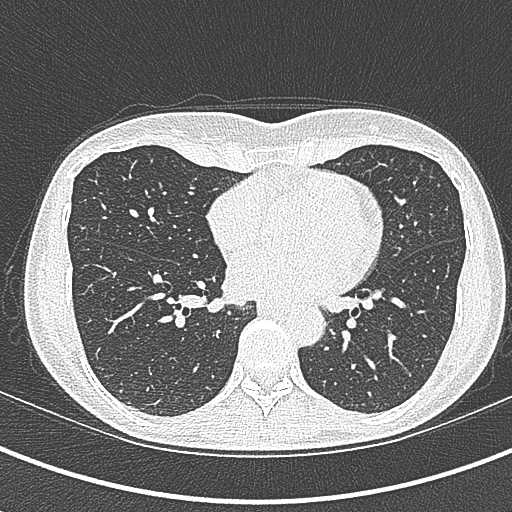
[im 162/323  lung]
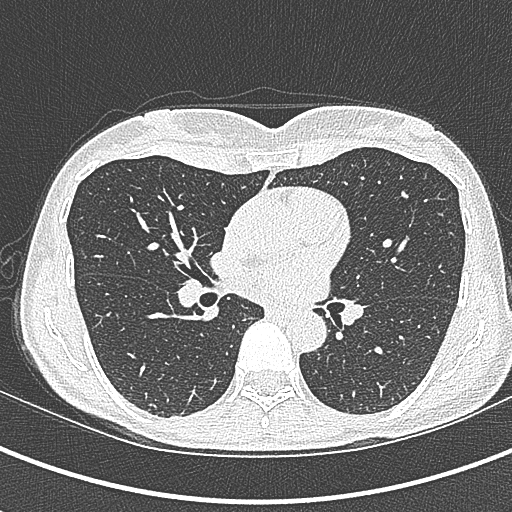
[im 188/323  lung]
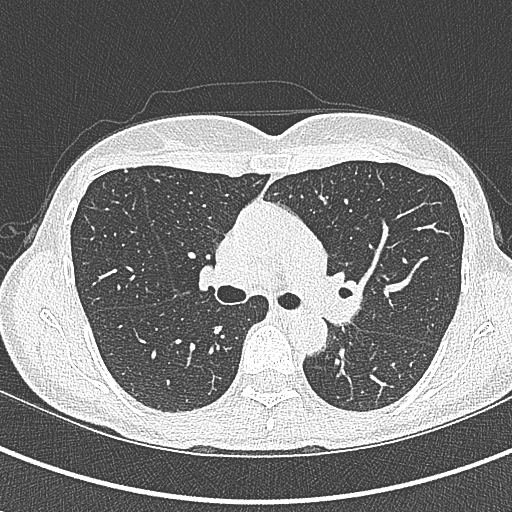
[im 215/323  lung]
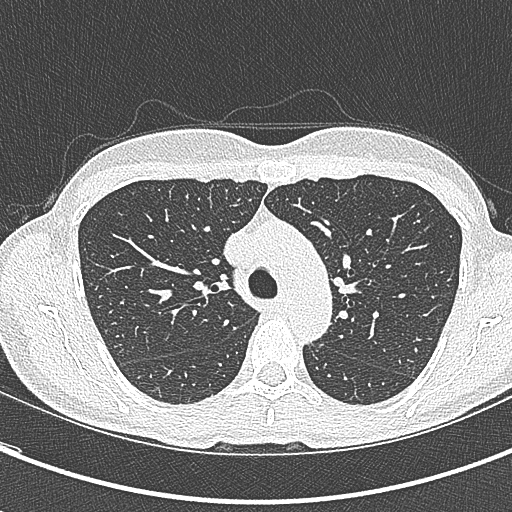
[im 242/323  mediastinal]
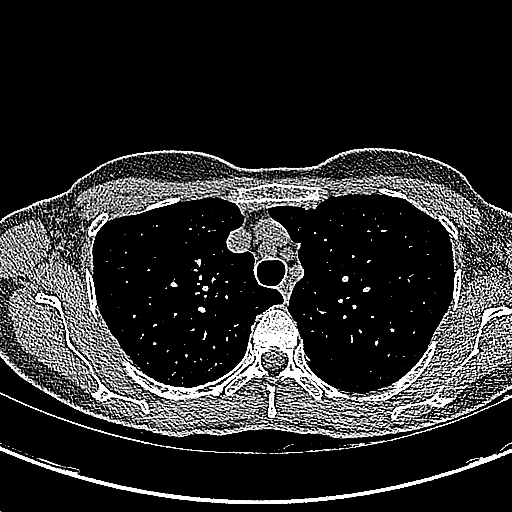
[im 242/323  lung]
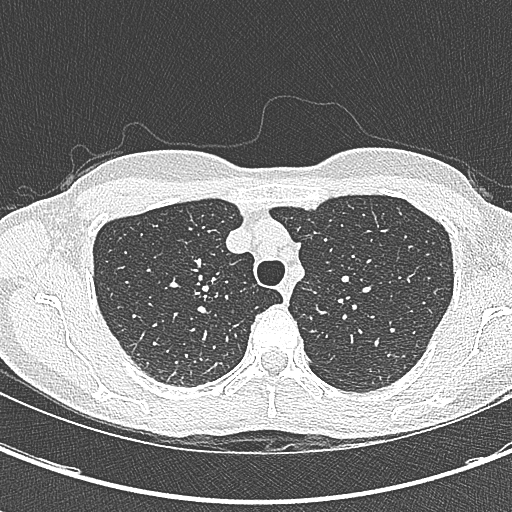
[im 269/323  lung]
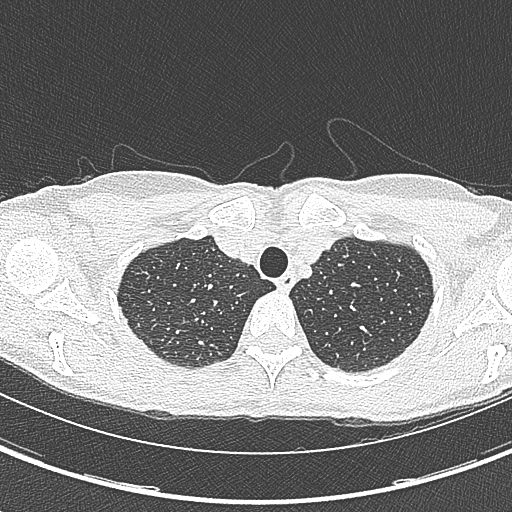
[im 296/323  lung]
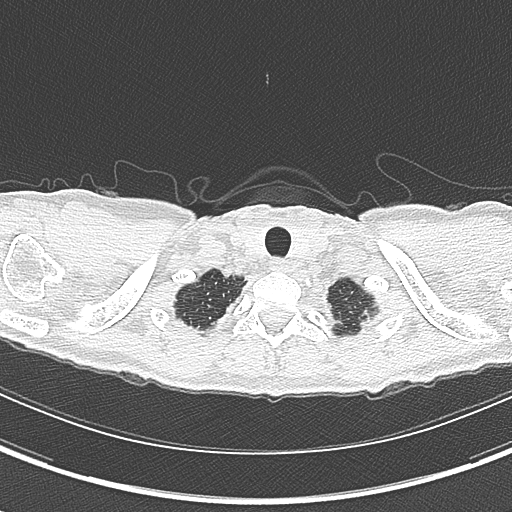

[Series 12: coronal · coronal · 0.59mm/px · 3 of 94 slices shown]
[im 19/94  lung]
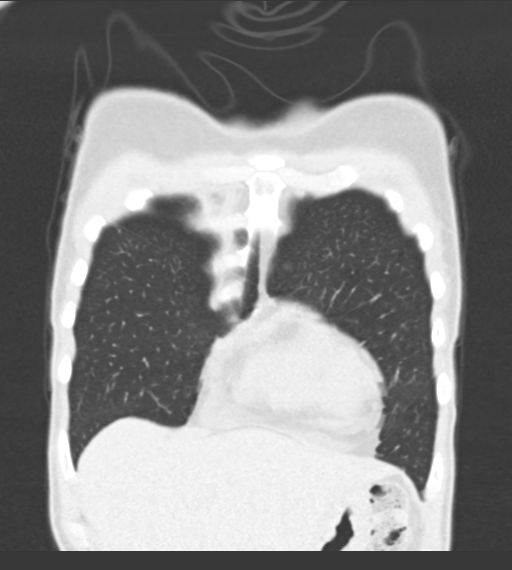
[im 38/94  lung]
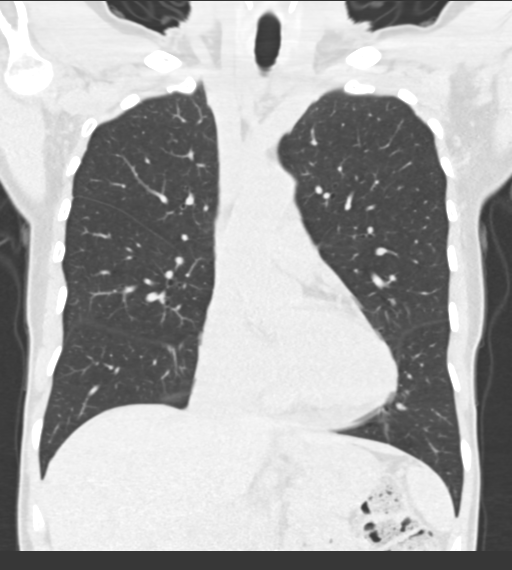
[im 56/94  lung]
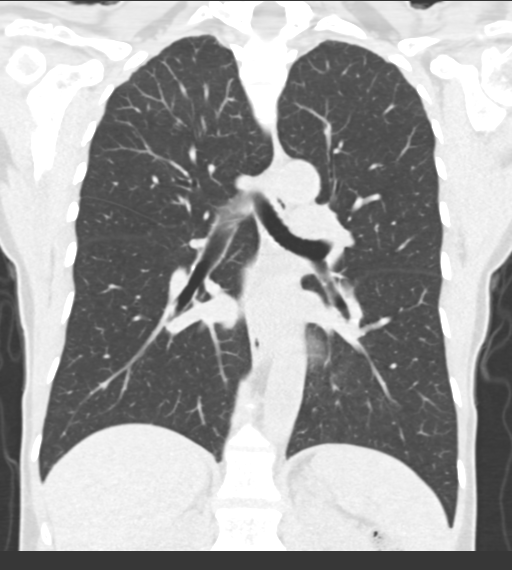

[14 of 36 positions shown; findings below may reference images not displayed]

FINDINGS: Cardiovascular: No significant vascular findings. Normal heart size.
No pericardial effusion.

Mediastinum/Nodes: No enlarged mediastinal, hilar, or axillary lymph
nodes. Thyroid gland, trachea, and esophagus demonstrate no
significant findings.

Lungs/Pleura: No evidence of fibrotic interstitial lung disease. No
significant air trapping on expiratory phase imaging. No pleural
effusion or pneumothorax.

Upper Abdomen: No acute abnormality.

Musculoskeletal: No chest wall abnormality. No suspicious osseous
lesions identified.
IMPRESSION: No evidence of fibrotic interstitial lung disease. No findings to
explain shortness of breath.

## 2024-03-25 ENCOUNTER — Ambulatory Visit: Admitting: Sports Medicine

## 2024-03-25 ENCOUNTER — Other Ambulatory Visit: Payer: Self-pay

## 2024-03-25 ENCOUNTER — Encounter: Payer: Self-pay | Admitting: Sports Medicine

## 2024-03-25 VITALS — BP 95/58 | Ht 62.0 in | Wt 102.0 lb

## 2024-03-25 DIAGNOSIS — M25561 Pain in right knee: Secondary | ICD-10-CM

## 2024-03-25 DIAGNOSIS — M23203 Derangement of unspecified medial meniscus due to old tear or injury, right knee: Secondary | ICD-10-CM

## 2024-03-25 NOTE — Assessment & Plan Note (Signed)
-   US shows a degenerative medial meniscus tear but her knee overall is in very good shape.  - She can start hip abduction exercises on that side as she has some weakness that is likely adding to her pain.  - If her pain is great enough she can use ice or voltaren gel but overall she can continue her regular activity - We will see her back as needed for this.

## 2024-03-25 NOTE — Progress Notes (Signed)
 Sherri Wilson - 63 y.o. female MRN [id number]  Date of birth: [birth year]  PCP: Melida Quitter, MD  Subjective:  No chief complaint on file.  Right medial knee pain  HPI: Past Medical, Surgical, Social, and Family History Reviewed & Updated per EMR.   Patient is a 63 y.o. female here for right medial knee pain. She had a horse fall on her a year ago which landed on the medial aspect of the right LE and was put in a walking boot for treatment of a foot fracture. Since then she has had some mild-moderate, aching, non radiating pain present only at night independent of any day activities. There has been no swelling redness, bruising, numbness, or weakness and she denies any locking, popping or clicking.   Past Medical History:  Diagnosis Date   GERD (gastroesophageal reflux disease)    Kidney stones     Current Outpatient Medications on File Prior to Visit  Medication Sig Dispense Refill   estradiol (VIVELLE-DOT) 0.0375 MG/24HR Apply one patch twice weekly 24 patch 3   estradiol (VIVELLE-DOT) 0.0375 MG/24HR Place 1 patch onto the skin 2 (two) times a week as directed. 24 patch 3   progesterone (PROMETRIUM) 100 MG capsule TAKE 1 CAPSULE BY MOUTH ONCE A DAY AT BEDTIME 90 capsule 3   progesterone (PROMETRIUM) 100 MG capsule Take 1 capsule (100 mg total) by mouth at bedtime. 90 capsule 3   VITAMIN D, CHOLECALCIFEROL, PO Take by mouth.     No current facility-administered medications on file prior to visit.    Past Surgical History:  Procedure Laterality Date   EXTERNAL EAR SURGERY Left    plastic surgery, bitten by a horse    No Known Allergies      Objective:  Physical Exam: VS: BP:(!) 95/58  HR: bpm  TEMP: ( )  RESP:   HT:5\' 2"  (157.5 cm)   WT:102 lb (46.3 kg)  BMI:18.65  Gen: NAD, speaks clearly, comfortable in exam room Respiratory: Normal respiratory effort on room air. No signs of distress Skin: No rashes, abrasions, or ecchymosis MSK:  Inspection of the right knee  shows no obvious abnormality  ROM is full Strength 5/5 in knee flexion, and extension Hip abduction strength 5/5 on the left and 3/5 on the right Knee w/out effusion or edema Varus/valgus stress test neg Patella and condyles NT Medial JLT  Ultrasound of the Right knee No effusion in the suprapatellar pouch Patellar and quadriceps tendons are normal Lateral meniscus is in intact with no narrowing along the lateral joint line Medial meniscus shows some linear hypoechoic change in the mid portion w/ surrounding hyperechoic changes surrounding, joint line is mildly narrowed with no spurring  Posterior meniscus laterally is visualized and appears intact. Vessels visualized and patent.   Summary: Degenerative tear of medial meniscus  Ultrasound and interpretation by Dr. Webb Silversmith and Dr. Darrick Penna    Assessment & Plan:   Degenerative tear of medial meniscus of right knee - US shows a degenerative medial meniscus tear but her knee overall is in very good shape.  - She can start hip abduction exercises on that side as she has some weakness that is likely adding to her pain.  - If her pain is great enough she can use ice or voltaren gel but overall she can continue her regular activity - We will see her back as needed for this.     Rica Mote MD [facility] Fellow  I observed and examined  the patient with the [facility] resident and agree with assessment and plan.  Note reviewed and modified by me. Sterling Big, MD

## 2024-04-17 ENCOUNTER — Other Ambulatory Visit (HOSPITAL_BASED_OUTPATIENT_CLINIC_OR_DEPARTMENT_OTHER): Payer: Self-pay

## 2024-04-17 MED ORDER — BOOSTRIX 5-2.5-18.5 LF-MCG/0.5 IM SUSY
0.5000 mL | PREFILLED_SYRINGE | Freq: Once | INTRAMUSCULAR | 0 refills | Status: AC
  Filled 2024-04-17: qty 0.5, 1d supply, fill #0

## 2024-05-14 ENCOUNTER — Other Ambulatory Visit (HOSPITAL_BASED_OUTPATIENT_CLINIC_OR_DEPARTMENT_OTHER): Payer: Self-pay

## 2024-05-14 MED ORDER — ZOSTER VAC RECOMB ADJUVANTED 50 MCG/0.5ML IM SUSR
0.5000 mL | Freq: Once | INTRAMUSCULAR | 1 refills | Status: AC
  Filled 2024-05-14: qty 0.5, 1d supply, fill #0
  Filled 2024-07-31: qty 0.5, 1d supply, fill #1

## 2024-06-17 ENCOUNTER — Ambulatory Visit: Admitting: Sports Medicine

## 2024-06-19 ENCOUNTER — Ambulatory Visit: Admitting: Sports Medicine

## 2024-06-19 VITALS — BP 100/58 | Ht 62.0 in | Wt 101.0 lb

## 2024-06-19 DIAGNOSIS — G5702 Lesion of sciatic nerve, left lower limb: Secondary | ICD-10-CM

## 2024-06-19 NOTE — Progress Notes (Signed)
 PCP: Stephane Leita DEL, MD  Subjective:   HPI: Patient is a 63 y.o. female here for right calf numbness.  She has been doing physical therapy for a right knee meniscal tear and during the process developed pain in her left calf. She continued to do her PT and around 1 month ago felt a popping sensation in her calf. After this point, she states her pain resolved but she began having numbness to the posterolateral calf. The numbness does not extend into her foot nor up past the knee. It does not interfere with her day to day function and she has not had any weakness or burning along with it. She has never had symptoms like this before in either her legs or arms. She states her knees feel improved from prior and she is not having any pain in her back. She does not know of any trauma to the knee or ankle but does note she has been doing hip ROM exercises recently that have her flex her knee and flex/abduct her hip. (FABER position) and these exercises do feel like a significant stretch for her  Past Medical History:  Diagnosis Date   GERD (gastroesophageal reflux disease)    Kidney stones     Current Outpatient Medications on File Prior to Visit  Medication Sig Dispense Refill   estradiol  (VIVELLE -DOT) 0.0375 MG/24HR Apply one patch twice weekly 24 patch 3   estradiol  (VIVELLE -DOT) 0.0375 MG/24HR Place 1 patch onto the skin 2 (two) times a week as directed. 24 patch 3   progesterone  (PROMETRIUM ) 100 MG capsule TAKE 1 CAPSULE BY MOUTH ONCE A DAY AT BEDTIME 90 capsule 3   progesterone  (PROMETRIUM ) 100 MG capsule Take 1 capsule (100 mg total) by mouth at bedtime. 90 capsule 3   VITAMIN D, CHOLECALCIFEROL, PO Take by mouth.     No current facility-administered medications on file prior to visit.    Past Surgical History:  Procedure Laterality Date   EXTERNAL EAR SURGERY Left    plastic surgery, bitten by a horse    No Known Allergies  BP (!) 100/58   Ht 5' 2 (1.575 m)   Wt 101 lb (45.8 kg)    BMI 18.47 kg/m       No data to display              No data to display              Objective:  Physical Exam:  Gen: NAD, comfortable in exam room  Neuro: Sensation: Diminished but present sensation to light touch to the anterolateral calf in the area of the common peroneal nerve innervation. Preserved sensation in thighs, medial calves, feet Reflexes: 2+ patella, 2+ achilles, clonus absent  There is no tenderness at the fibular head. Tinel's test behind the fibular head does not change the pattern of sensation or cause pain  Walking and standing alignment is good  MSK:  Ankle Inspection: No bruising, swelling. Palpation: No tenderness along gastroc, tibia, fibula.  ROM: Dorsiflexion 20 deg, plantar flexion 50 deg, inversion 30 deg, eversion 10 deg Strength: plantarflexion 5/5, dorsiflexion  5/5, inversion  5/5, eversion  5/5    Assessment & Plan:  Patient is a 63 y.o. female here for left calf numbness for one month. Suspect she has a stretch neuropraxia to the common peroneal nerve from her hip ROM exercises given the distribution of her numbness with an otherwise negative physical exam. Likely a Grade I injury given she is only  having numbness at this time. Plan for now is to discontinue this exercise and use B6 100 mg orally, arnica, and compression sleeve to encourage healing to the nerve. Will follow-up in 12 weeks should her symptoms not be improved.  Common Peroneal Nerve Neuropraxia, Grade I - Discontinue hip ROM exercises - B6 supplementation - Arnica cream to area - Compression sleeve when exercising - Follow-up in 12 weeks should symptoms persist   Bernardino Grip, MS4 [facility]

## 2024-06-19 NOTE — Patient Instructions (Signed)
 We suspect you have a nerve stretch injury to the common peroneal nerve called a stretch neuropraxia. This will likely take several weeks to recover. In the meantime, try to not do the hip mobility exercises you were originally doing. In addition, begin taking a B6 supplement, wear a compression sleeve when you are exercising, and apply Arnica cream to the area of numbness. It was nice to take care of you and follow-up with us  should your symptoms not start to improve after 12 weeks!

## 2024-06-19 NOTE — Assessment & Plan Note (Signed)
 This seems relatively mild See Treatment plan  I advised patient that this is slow to resolve but generally improves with 2 to 3 months time.

## 2024-06-26 ENCOUNTER — Other Ambulatory Visit (HOSPITAL_BASED_OUTPATIENT_CLINIC_OR_DEPARTMENT_OTHER): Payer: Self-pay

## 2024-07-15 ENCOUNTER — Other Ambulatory Visit (HOSPITAL_BASED_OUTPATIENT_CLINIC_OR_DEPARTMENT_OTHER): Payer: Self-pay

## 2024-07-15 MED ORDER — TRETINOIN 0.025 % EX CREA
TOPICAL_CREAM | CUTANEOUS | 2 refills | Status: AC
  Filled 2024-07-15: qty 20, 30d supply, fill #0

## 2024-07-31 ENCOUNTER — Other Ambulatory Visit (HOSPITAL_BASED_OUTPATIENT_CLINIC_OR_DEPARTMENT_OTHER): Payer: Self-pay

## 2024-08-05 ENCOUNTER — Other Ambulatory Visit (HOSPITAL_BASED_OUTPATIENT_CLINIC_OR_DEPARTMENT_OTHER): Payer: Self-pay

## 2024-09-05 ENCOUNTER — Other Ambulatory Visit (HOSPITAL_BASED_OUTPATIENT_CLINIC_OR_DEPARTMENT_OTHER): Payer: Self-pay

## 2024-09-11 ENCOUNTER — Other Ambulatory Visit (HOSPITAL_BASED_OUTPATIENT_CLINIC_OR_DEPARTMENT_OTHER): Payer: Self-pay

## 2024-09-11 MED ORDER — COMIRNATY 30 MCG/0.3ML IM SUSY
0.3000 mL | PREFILLED_SYRINGE | Freq: Once | INTRAMUSCULAR | 0 refills | Status: AC
  Filled 2024-09-11: qty 0.3, 1d supply, fill #0

## 2024-09-11 MED ORDER — FLUZONE HIGH-DOSE 0.5 ML IM SUSY
0.5000 mL | PREFILLED_SYRINGE | Freq: Once | INTRAMUSCULAR | 0 refills | Status: AC
Start: 2024-09-11 — End: 2024-09-12
  Filled 2024-09-11: qty 0.5, 1d supply, fill #0

## 2024-09-15 ENCOUNTER — Other Ambulatory Visit: Payer: Self-pay

## 2024-09-15 ENCOUNTER — Other Ambulatory Visit (HOSPITAL_BASED_OUTPATIENT_CLINIC_OR_DEPARTMENT_OTHER): Payer: Self-pay

## 2024-09-16 ENCOUNTER — Other Ambulatory Visit (HOSPITAL_BASED_OUTPATIENT_CLINIC_OR_DEPARTMENT_OTHER): Payer: Self-pay

## 2024-10-01 DIAGNOSIS — L812 Freckles: Secondary | ICD-10-CM | POA: Diagnosis not present

## 2024-10-01 DIAGNOSIS — I788 Other diseases of capillaries: Secondary | ICD-10-CM | POA: Diagnosis not present

## 2024-10-01 DIAGNOSIS — L308 Other specified dermatitis: Secondary | ICD-10-CM | POA: Diagnosis not present

## 2024-10-01 DIAGNOSIS — L821 Other seborrheic keratosis: Secondary | ICD-10-CM | POA: Diagnosis not present

## 2024-10-01 DIAGNOSIS — D1801 Hemangioma of skin and subcutaneous tissue: Secondary | ICD-10-CM | POA: Diagnosis not present

## 2024-10-13 ENCOUNTER — Other Ambulatory Visit (HOSPITAL_COMMUNITY): Payer: Self-pay

## 2024-11-12 ENCOUNTER — Other Ambulatory Visit (HOSPITAL_BASED_OUTPATIENT_CLINIC_OR_DEPARTMENT_OTHER): Payer: Self-pay

## 2024-11-12 DIAGNOSIS — Z1151 Encounter for screening for human papillomavirus (HPV): Secondary | ICD-10-CM | POA: Diagnosis not present

## 2024-11-12 DIAGNOSIS — Z124 Encounter for screening for malignant neoplasm of cervix: Secondary | ICD-10-CM | POA: Diagnosis not present

## 2024-11-12 DIAGNOSIS — Z681 Body mass index (BMI) 19 or less, adult: Secondary | ICD-10-CM | POA: Diagnosis not present

## 2024-11-12 DIAGNOSIS — Z13820 Encounter for screening for osteoporosis: Secondary | ICD-10-CM | POA: Diagnosis not present

## 2024-11-12 DIAGNOSIS — Z1231 Encounter for screening mammogram for malignant neoplasm of breast: Secondary | ICD-10-CM | POA: Diagnosis not present

## 2024-11-12 DIAGNOSIS — Z01419 Encounter for gynecological examination (general) (routine) without abnormal findings: Secondary | ICD-10-CM | POA: Diagnosis not present

## 2024-11-12 MED ORDER — PROGESTERONE MICRONIZED 100 MG PO CAPS
100.0000 mg | ORAL_CAPSULE | Freq: Every day | ORAL | 3 refills | Status: AC
  Filled 2024-11-12 – 2024-12-09 (×2): qty 90, 90d supply, fill #0

## 2024-11-12 MED ORDER — ESTRADIOL 0.0375 MG/24HR TD PTTW
1.0000 | MEDICATED_PATCH | TRANSDERMAL | 3 refills | Status: AC
  Filled 2024-11-12 – 2024-12-09 (×2): qty 24, 84d supply, fill #0

## 2024-12-08 ENCOUNTER — Other Ambulatory Visit (HOSPITAL_BASED_OUTPATIENT_CLINIC_OR_DEPARTMENT_OTHER): Payer: Self-pay

## 2024-12-09 ENCOUNTER — Other Ambulatory Visit (HOSPITAL_BASED_OUTPATIENT_CLINIC_OR_DEPARTMENT_OTHER): Payer: Self-pay

## 2024-12-11 ENCOUNTER — Other Ambulatory Visit (HOSPITAL_BASED_OUTPATIENT_CLINIC_OR_DEPARTMENT_OTHER): Payer: Self-pay

## 2025-01-09 ENCOUNTER — Other Ambulatory Visit (HOSPITAL_BASED_OUTPATIENT_CLINIC_OR_DEPARTMENT_OTHER): Payer: Self-pay
